# Patient Record
Sex: Female | Born: 1960 | Race: White | Hispanic: No | Marital: Single | Smoking: Never smoker
Health system: Southern US, Community
[De-identification: ages and names within clinical notes are randomized; demographics above are authoritative.]

## PROBLEM LIST (undated history)

## (undated) DIAGNOSIS — M810 Age-related osteoporosis without current pathological fracture: Secondary | ICD-10-CM

## (undated) DIAGNOSIS — E079 Disorder of thyroid, unspecified: Secondary | ICD-10-CM

## (undated) HISTORY — DX: Disorder of thyroid, unspecified: E07.9

## (undated) HISTORY — DX: Age-related osteoporosis without current pathological fracture: M81.0

---

## 1993-04-03 HISTORY — PX: CHOLECYSTECTOMY: SHX55

## 2001-08-30 ENCOUNTER — Ambulatory Visit (HOSPITAL_COMMUNITY): Admission: RE | Admit: 2001-08-30 | Discharge: 2001-08-30 | Payer: Self-pay | Admitting: Obstetrics & Gynecology

## 2002-10-30 ENCOUNTER — Ambulatory Visit (HOSPITAL_COMMUNITY): Admission: RE | Admit: 2002-10-30 | Discharge: 2002-10-30 | Payer: Self-pay | Admitting: Obstetrics & Gynecology

## 2002-10-30 ENCOUNTER — Encounter: Payer: Self-pay | Admitting: Obstetrics & Gynecology

## 2002-11-11 ENCOUNTER — Ambulatory Visit (HOSPITAL_COMMUNITY): Admission: RE | Admit: 2002-11-11 | Discharge: 2002-11-11 | Payer: Self-pay | Admitting: Obstetrics & Gynecology

## 2002-11-11 ENCOUNTER — Encounter: Payer: Self-pay | Admitting: Obstetrics & Gynecology

## 2004-01-21 ENCOUNTER — Ambulatory Visit (HOSPITAL_COMMUNITY): Admission: RE | Admit: 2004-01-21 | Discharge: 2004-01-21 | Payer: Self-pay | Admitting: Obstetrics & Gynecology

## 2004-02-22 ENCOUNTER — Ambulatory Visit (HOSPITAL_COMMUNITY): Admission: RE | Admit: 2004-02-22 | Discharge: 2004-02-22 | Payer: Self-pay | Admitting: Neurology

## 2004-03-29 ENCOUNTER — Emergency Department (HOSPITAL_COMMUNITY): Admission: EM | Admit: 2004-03-29 | Discharge: 2004-03-29 | Payer: Self-pay | Admitting: Emergency Medicine

## 2006-05-09 ENCOUNTER — Ambulatory Visit: Payer: Self-pay | Admitting: Gastroenterology

## 2006-05-16 ENCOUNTER — Ambulatory Visit (HOSPITAL_COMMUNITY): Admission: RE | Admit: 2006-05-16 | Discharge: 2006-05-16 | Payer: Self-pay | Admitting: Gastroenterology

## 2006-05-16 ENCOUNTER — Encounter (INDEPENDENT_AMBULATORY_CARE_PROVIDER_SITE_OTHER): Payer: Self-pay | Admitting: Specialist

## 2006-05-16 ENCOUNTER — Ambulatory Visit: Payer: Self-pay | Admitting: Gastroenterology

## 2006-05-17 ENCOUNTER — Ambulatory Visit (HOSPITAL_COMMUNITY): Admission: RE | Admit: 2006-05-17 | Discharge: 2006-05-17 | Payer: Self-pay | Admitting: Gastroenterology

## 2006-10-09 ENCOUNTER — Ambulatory Visit (HOSPITAL_COMMUNITY): Admission: RE | Admit: 2006-10-09 | Discharge: 2006-10-09 | Payer: Self-pay | Admitting: Obstetrics & Gynecology

## 2006-10-12 ENCOUNTER — Encounter: Admission: RE | Admit: 2006-10-12 | Discharge: 2006-10-12 | Payer: Self-pay | Admitting: Obstetrics & Gynecology

## 2007-03-26 ENCOUNTER — Ambulatory Visit (HOSPITAL_COMMUNITY): Admission: RE | Admit: 2007-03-26 | Discharge: 2007-03-26 | Payer: Self-pay | Admitting: Internal Medicine

## 2007-09-04 ENCOUNTER — Ambulatory Visit (HOSPITAL_COMMUNITY): Admission: RE | Admit: 2007-09-04 | Discharge: 2007-09-04 | Payer: Self-pay | Admitting: Internal Medicine

## 2008-01-16 ENCOUNTER — Encounter: Admission: RE | Admit: 2008-01-16 | Discharge: 2008-01-16 | Payer: Self-pay | Admitting: Obstetrics and Gynecology

## 2009-05-29 IMAGING — CR DG SMALL BOWEL
15 series · 15 of 15 positions shown · non-contrast
Comparison: CT abdomen pelvis of 05/17/2006

CLINICAL DATA: Diarrhea, history of proctitis - - - fluoroscopy
time 1.3 minutes

SMALL BOWEL SERIES
TECHNIQUE: Following ingestion of barium, serial small bowel
images were obtained including spot views of the terminal ileum

[run (1 of 8)]
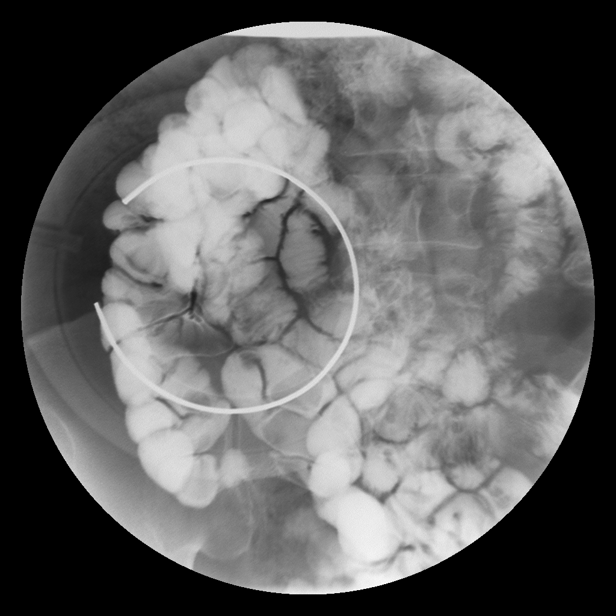

[run (2 of 8)]
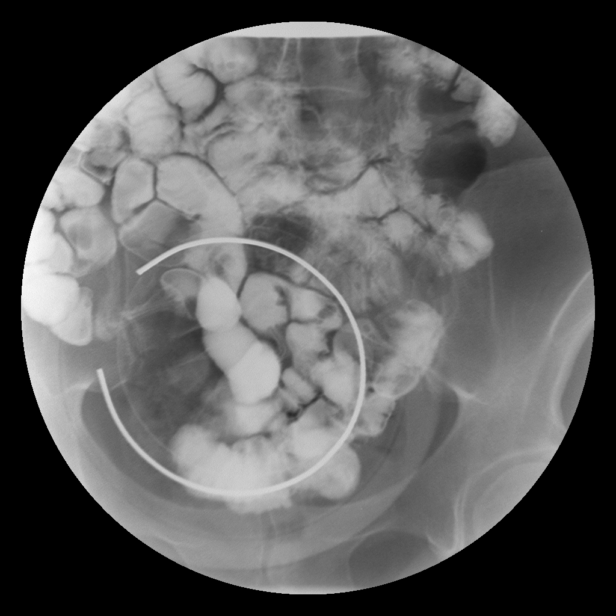

[run (3 of 8)]
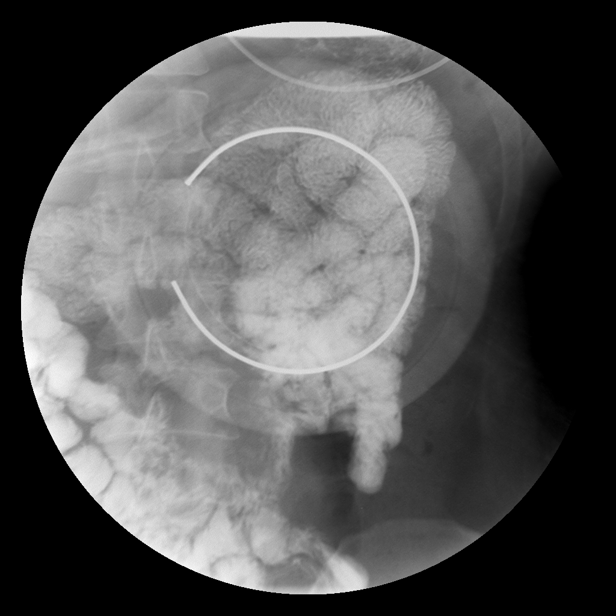

[run (4 of 8)]
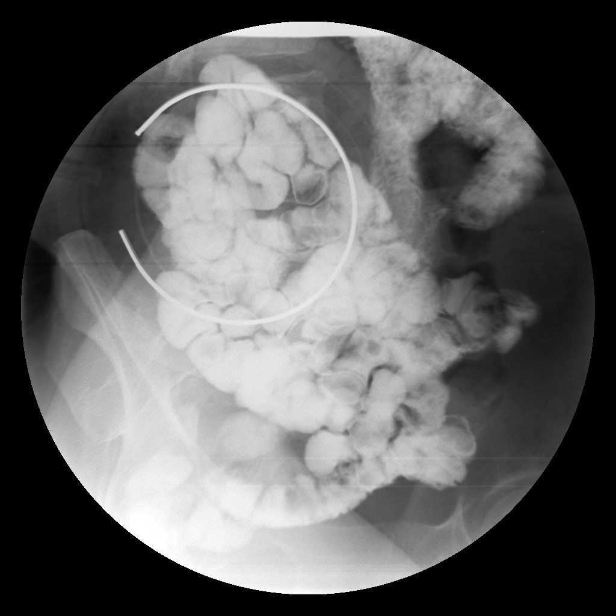

[run (5 of 8)]
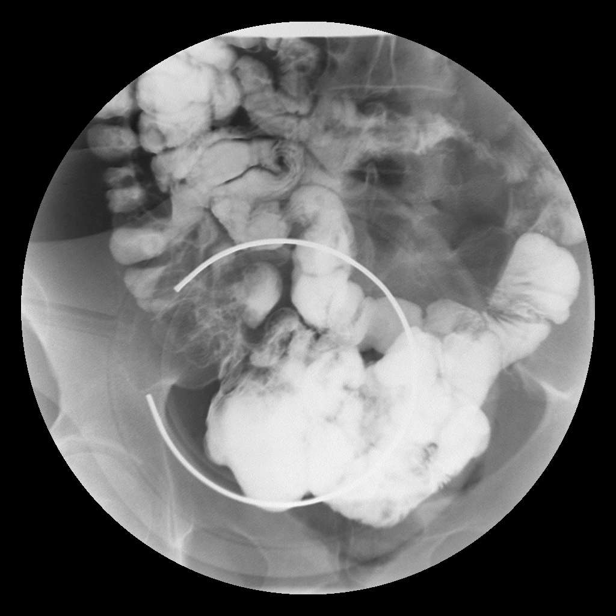

[run (6 of 8)]
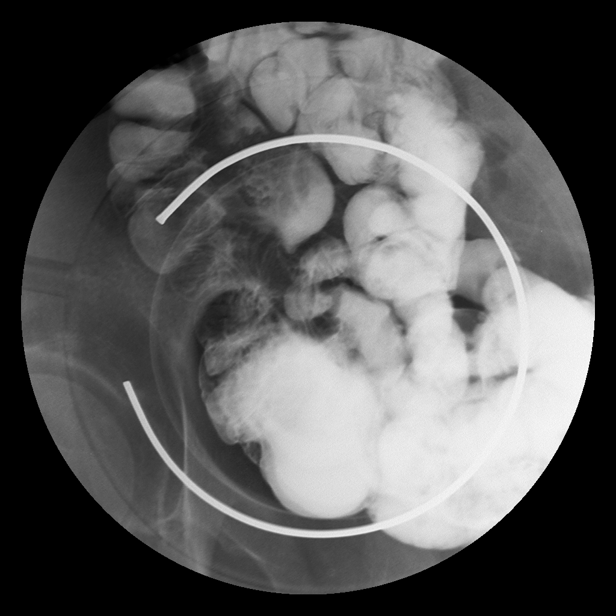

[run (7 of 8)]
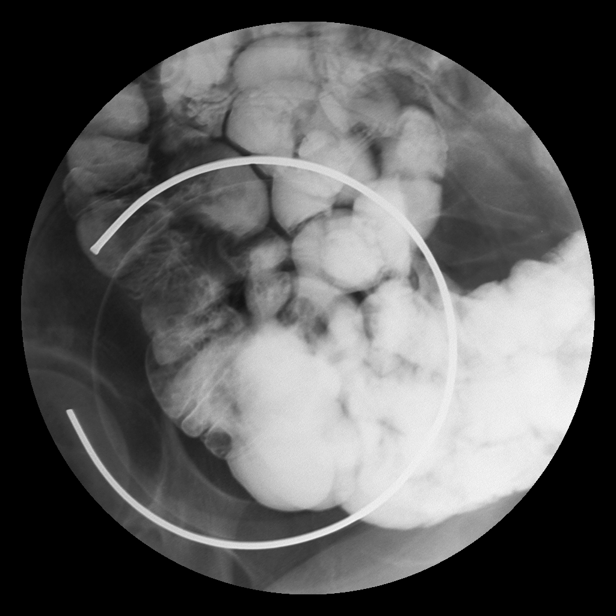

[run (8 of 8)]
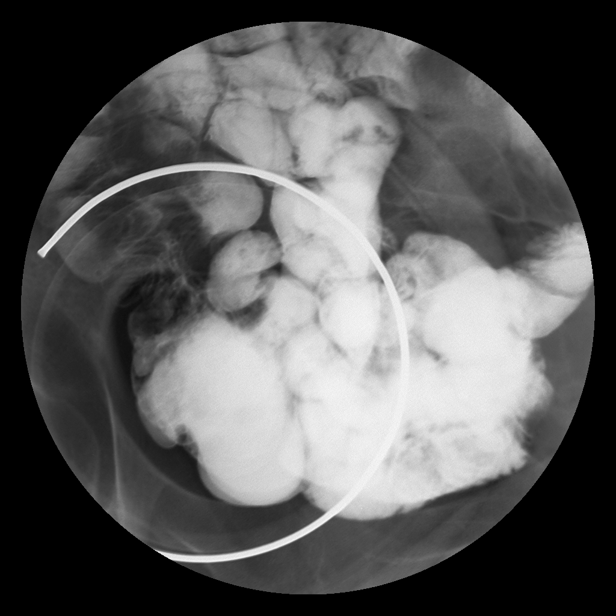

[view not recorded (1 of 7)]
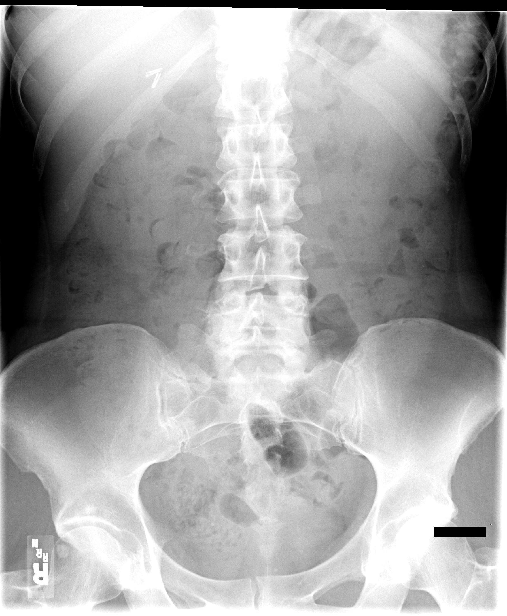

[view not recorded (2 of 7)]
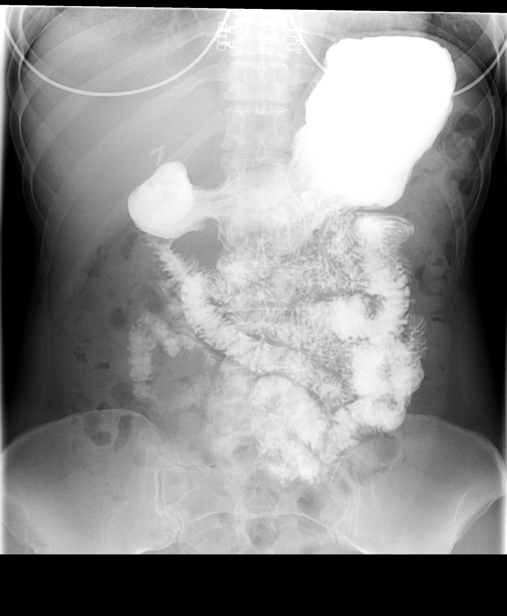

[view not recorded (3 of 7)]
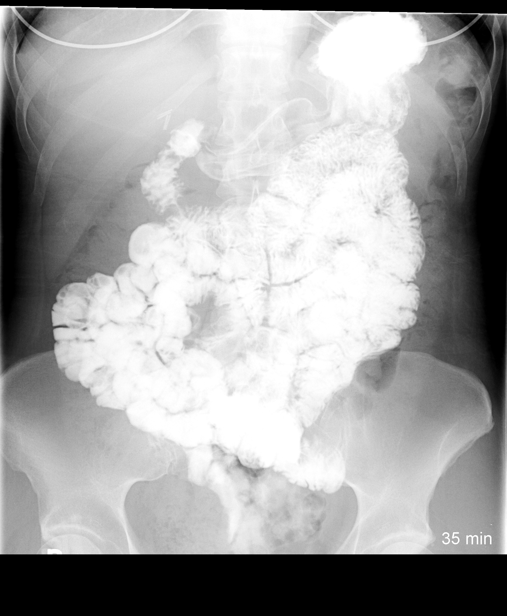

[view not recorded (4 of 7)]
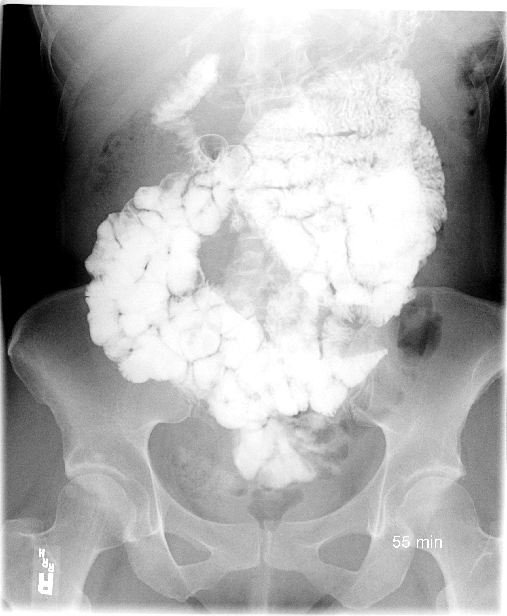

[view not recorded (5 of 7)]
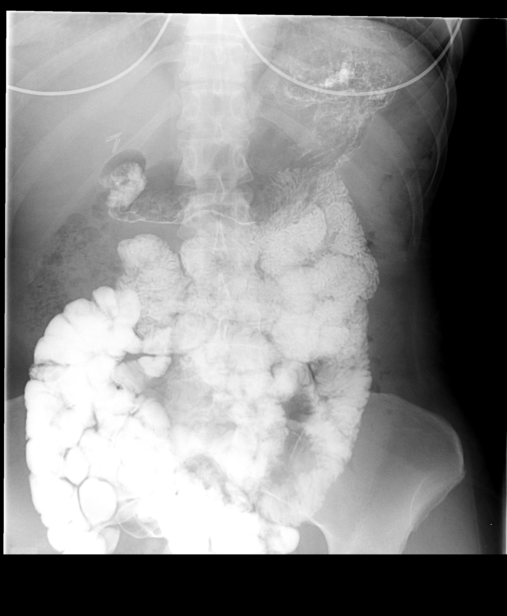

[view not recorded (6 of 7)]
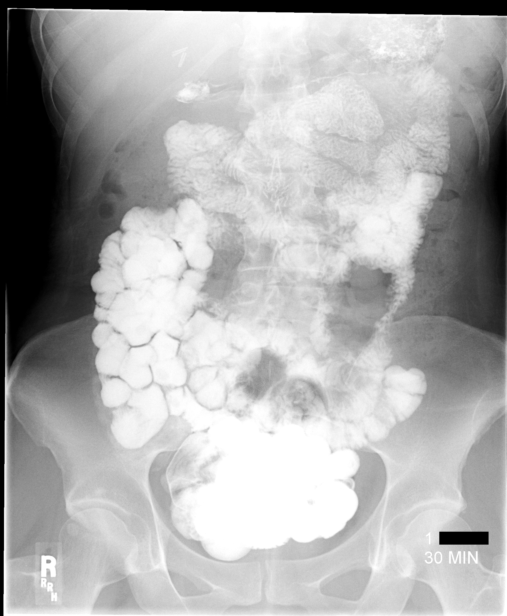

[view not recorded (7 of 7)]
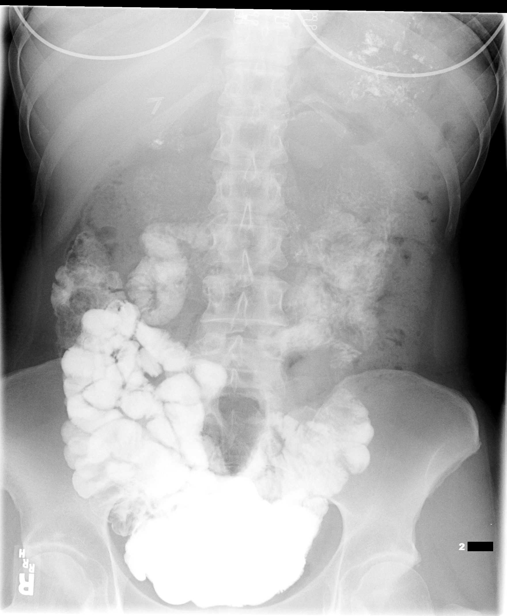

[15 of 15 positions shown; findings below may reference images not displayed]

FINDINGS: A prominent film the abdomen shows feces throughout the
colon.  No bowel obstruction is seen.  Surgical clips are present
from prior cholecystectomy.

The patient was given barium orally and images of the small bowel
were obtained.  The small bowel mucosal pattern appears normal with
no evidence of edema, mass, or dilatation.  The terminal ileum is
overlain within the pelvis, but on oblique spot films appears
grossly normal.
IMPRESSION: Negative small-bowel follow-through.  Terminal ileum not well seen
but appears grossly normal on spot films.

## 2009-09-02 ENCOUNTER — Encounter: Admission: RE | Admit: 2009-09-02 | Discharge: 2009-09-02 | Payer: Self-pay | Admitting: Obstetrics & Gynecology

## 2009-10-21 ENCOUNTER — Other Ambulatory Visit: Admission: RE | Admit: 2009-10-21 | Discharge: 2009-10-21 | Payer: Self-pay | Admitting: Obstetrics and Gynecology

## 2010-04-23 ENCOUNTER — Encounter: Payer: Self-pay | Admitting: Obstetrics & Gynecology

## 2010-04-24 ENCOUNTER — Encounter: Payer: Self-pay | Admitting: Obstetrics & Gynecology

## 2010-08-16 NOTE — Op Note (Signed)
[name], Julia Martin           ACCOUNT NO.:  [id number]   MEDICAL RECORD NO.:  [id number]          PATIENT TYPE:  AMB   LOCATION:  DAY                           FACILITY:  [facility]   PHYSICIAN:  Lionel December, M.D.    DATE OF BIRTH:  03-19-1961   DATE OF PROCEDURE:  03/26/2007  DATE OF DISCHARGE:                               OPERATIVE REPORT   PROCEDURE:  Flexible sigmoidoscopy.   INDICATION:  [name] is a 50 year old, Caucasian female, who has had a  few episodes of hematochezia.  She also has had some discomfort relating  to her hemorrhoids.  She is undergoing sigmoidoscopy to make sure she  not have proctitis or other lesions occult for hematochezia.  Procedure  is reviewed with the patient.  Informed consent was obtained.   MEDICATIONS FOR CONSCIOUS SEDATION:  1. Demerol 50 mg IV.  2. Versed 3 mg IV.   FINDINGS:  Procedure performed in endoscopy suite.  The patient's vital  signs and O2 saturations were monitored during the procedure and  remained stable.  The patient was placed in left lateral position and  rectal examination performed.  No abnormality noted on external or  digital exam.  Pentax video scope was placed in the rectum and advanced  under vision into sigmoid colon.  The scope was passed in descending  colon just below the splenic flexure.  Preparation was satisfactory.  As  the scope was withdrawn, colonic mucosa was carefully examined and was  normal.  Rectal mucosa similarly was normal.  Scope was retroflexed to  examine anorectal junction.  Small hemorrhoids noted below the dentate  line with some thickening through the mucosa of the anal canal.  No  other lesions were noted.  The scope was straightened and withdrawn.  The patient tolerated the procedure well.   FINAL DIAGNOSIS:  Small external hemorrhoids, otherwise normal flexible  sigmoidoscopy.   RECOMMENDATIONS:  1. She will resume her usual diet and medications.  2. She may use Anusol HC cream on  p.r.n. basis for discomfort.  3. She may consider screening for colorectal cancer in 5 years when      she turns 51.      Lionel December, M.D.  Electronically Signed     NR/MEDQ  D:  03/26/2007  T:  03/26/2007  Job:  [id number]   cc:   Kingsley Callander. Ouida Sills, MD  Fax: [phone]

## 2010-08-19 NOTE — Op Note (Signed)
Julia Martin, Julia Martin           ACCOUNT NO.:  [id number]   MEDICAL RECORD NO.:  [id number]          PATIENT TYPE:  AMB   LOCATION:  DAY                           FACILITY:  [facility]   PHYSICIAN:  Kassie Mends, M.D.      DATE OF BIRTH:  January 03, 1961   DATE OF PROCEDURE:  05/16/2006  DATE OF DISCHARGE:                                PROCEDURE NOTE   REFERRING PHYSICIAN:  Kingsley Callander. Ouida Sills, MD   PROCEDURE:  Esophagogastroduodenoscopy with cold forceps biopsies.   ENDOSCOPIST:  Kassie Mends, M.D.   INDICATION FOR EXAM:  Julia Martin is a 50 year old female who  complains of persistent abdominal pain and dyspepsia.  Her symptoms  began in [place].  She had a positive H. pylori which was  treated with Prevpac, but her symptoms are still unresolved.  She  recently went for a jog and had significant abdominal cramping  afterwards.  Her abdominal cramping was in the epigastrium and left  upper quadrant.  She did use anti-inflammatory drugs, but has  discontinued the use.  She also no longer drinks red wine.   FINDINGS:  1. Diffuse erythema of the antrum without erosion or ulceration.      Biopsies obtained to evaluate for H. pylori infection or      eosinophilic infiltration.  2. Otherwise normal esophagus without evidence of Barrett's, normal      duodenum.   RECOMMENDATIONS:  1. Resume previous diet and avoid gastric irritants.  She is given a      handout on gastric irritants.  2  Avoid aspirin and anti-inflammatory drugs for 30 days.  1. No anticoagulation for 7 days.  2. Return patient visit in 1 month.  She should also have a CT scan of      the abdomen with and without IV contrast.  3. I will call her with results of her biopsy report.   MEDICATIONS:  1. Demerol 75 mg IV.  2. Versed 4 mg IV.   PROCEDURE TECHNIQUE:  Physical exam was performed and informed consent  was obtained from the patient after explaining the benefits, risks and  alternatives to the procedure.  The  patient was connected to the monitor  and placed in the left lateral position.  Continuous oxygen was provided  by nasal cannula and IV medicine administered through an indwelling  cannula.  After administration of sedation, the patient's esophagus was  intubated and the  scope was advanced under direct visualization to the second portion of  the duodenum.  The scope was subsequent removed slowly by carefully  examining the color, texture, anatomy and integrity of the mucosa on the  way out.  The patient was recovered in the endoscopy suite and  discharged home in satisfactory condition.      Kassie Mends, M.D.  Electronically Signed     SM/MEDQ  D:  05/16/2006  T:  05/16/2006  Job:  [id number]   cc:   Kingsley Callander. Ouida Sills, MD  Fax: [phone]

## 2010-08-19 NOTE — Op Note (Signed)
[facility]  Patient:    Julia Martin, Julia Martin Visit Number: [id number] MRN: [id number]          Service Type: DSU Location: DAY Attending Physician:  Lazaro Arms Dictated by:   Duane Lope, M.D. Proc. Date: 08/30/01 Admit Date:  08/30/2001                             Operative Report  PREOPERATIVE DIAGNOSES:  1. Hypermenorrhea.  2. Dysmenorrhea.  POSTOPERATIVE DIAGNOSES:  1. Hypermenorrhea.  2. Dysmenorrhea.  PROCEDURE:  Hysteroscopy D&C with a thermochoice endometrial oblation.  SURGEON:  Duane Lope, M.D.  ANESTHESIA:  General with laryngeal mask airway.  FINDINGS:  The patient had a normal endometrial cavity, no polyps, no fibroids, no other abnormalities.  DESCRIPTION OF PROCEDURE:  The patient was taken to the operating room, placed in the supine position and underwent laryngeal mask airway general anesthesia. She was then placed in the dorsal lithotomy position, the vagina was prepped in the usual sterile fashion and she was draped and her bladder was drained. A speculum was placed, cervix was grasped with a single tooth tenaculum and 10 cc of 0.5% Marcaine with 1:200,000 epinephrine was placed as a paracervical block 10 cc on either side. The cervix was then dilated serially to allow passage of a 5 mm hysteroscope. Normal saline was used for a distending media. The endometrial cavity was found to be normal with no polyps, no submucosal abnormalities and no other abnormalities. A vigorous sharp curettage was then performed and tissue sent to pathology for evaluation. The endometrial oblation was then performed. A total of 14 cc of fluid was placed within the balloon and was maintained a pressure of above 180 during the entire procedure. In fact, as the fluid heated up because she has a relatively small uterus that sounded to 7, the pressure went up to 210. We then removed 1 cc of fluid and had a pressure of 180 and heated up again  to 87 degrees Celsius for a total therapy time of 9 minutes and 21 seconds. The patient tolerated the procedure well. The balloon was removed, it was intact and the sterile speculum was removed. There was no bleeding. The patient tolerated the procedure well and she was taken to the recovery room in good stable condition. All counts were correct x3. She had minimal bleeding. Dictated by:   Duane Lope, M.D. Attending Physician:  Lazaro Arms DD:  08/30/01 TD:  08/31/01 Job: [id number]

## 2010-08-19 NOTE — Group Therapy Note (Signed)
NAMEREENE, HARLACHER NO.:  [id number]   MEDICAL RECORD NO.:  [id number]          PATIENT TYPE:  EMS   LOCATION:  ED                            FACILITY:  [facility]   PHYSICIAN:  Kingsley Callander. Ouida Sills, MD       DATE OF BIRTH:  04/07/1960   DATE OF PROCEDURE:  DATE OF DISCHARGE:                                   PROGRESS NOTE   HISTORY:  This patient is a 50 year old white female who presented with a  one day history of nausea and vomiting and diarrhea.  This afternoon she  developed epigastric and chest discomfort.  She was initially seen in my  office and was sent to the emergency room for additional evaluation and  treatment.  She had vomited several times last night.  She had multiple  episodes of diarrhea.  She denied hematemesis, rectal bleeding, or melena.  She has previously had a cholecystectomy.  She takes Levoxyl and Darvocet.  She does not abuse NSAIDS or alcohol.   PHYSICAL EXAMINATION:  VITAL SIGNS:  She was afebrile at 97.1.  Her BP was  152/62 and her pulse was 56.  GENERAL:  She was quite anxious and tachypneic initially.  She had been  quite anxious and uncomfortable at my office prior to that.  HEENT:  No scleral icterus.  Oropharynx appears normal.  NECK:  No JVD or thyromegaly.  LUNGS:  Clear.  HEART:  Regular with no murmurs.  ABDOMEN:  Soft, nondistended.  No palpable organomegaly.  She has mild  epigastric tenderness.  EXTREMITIES:  No edema.  NEUROLOGIC:  Intact.   LABORATORY DATA:  EKG revealed a normal sinus rhythm with no ischemic  changes.  An acute abdominal series is unremarkable.  She has some  nonspecific bowel gas pattern.  White count is 10.1, hemoglobin 13.9,  platelets 287.  Amylase 64, SGOT 46, SGPT 33.  BUN 12, creatinine 0.8,  sodium 134, potassium 3.5.  Troponin-I less than 0.05.  Oxygen saturation  100% on room air.   IMPRESSION:  Gastroenteritis.  She felt as though she would vomit again in  my office but has not vomited since  last night now.  She has been treated  with a gastrointestinal cocktail and with Phenergan and feels much better.  She has been treated with intravenous fluids.  She is now comfortable.  She  will be discharged home and treated further Phenergan if needed.  Her  gastrointestinal cocktail was repeated.  She has been reassured.  She will  stick with clear liquids and then advance her diet to Saltines or plain  toast when she is able to tolerate it.  She was reassured there was no sign  of a cardiac etiology to her epigastric and chest symptoms.     Channing Mutters   ROF/MEDQ  D:  03/29/2004  T:  03/29/2004  Job:  [id number]

## 2010-08-19 NOTE — Consult Note (Signed)
Julia Martin, Julia Martin           ACCOUNT NO.:  [id number]   MEDICAL RECORD NO.:  [id number]         PATIENT TYPE:  AMB   LOCATION:                                FACILITY:  [facility]   PHYSICIAN:  Kassie Mends, M.D.      DATE OF BIRTH:  January 11, 1961   DATE OF CONSULTATION:  05/09/2006  DATE OF DISCHARGE:                                 CONSULTATION   REFERRING PHYSICIAN:  Carylon Perches.   REASON FOR CONSULTATION:  Abdominal pain.   HPI:  Julia Martin is a 50 year old female who has been bothered by her  stomach since Christmas.  She started taking Prilosec, and her symptoms  did not improve so she saw Dr. Ouida Sills.  He checked a H. pylori serology  which was 1.  He treated her with Prevpac, and her symptoms were a  little bit better but not improved.  She continues to take Prilosec 1 in  the morning and 1 at night.  She has a feeling of having a big burp and  she can't get it out.  This sensation is behind her sternum.  She feels  like she has a bad feeling of hunger in her upper abdomen, and eating  makes it better.  She has bad gas pains on her side.  She feels like she  did when she got pumped up for her cholecystectomy.  It is painful.  She  denies any nausea or vomiting, weight loss or constipation.  She has at  least 3 bowel movements a day and is mostly formed, sometimes soft.  She  denies any diarrhea or difficulty swallowing.  Her symptoms come in  waves, and she just wants to sit down.  They occur randomly and can last  all day.  She denies any jaundice.  When she had her gallbladder  removed, her symptoms were similar.  When she had her gallbladder  removed approximately 13 years ago, a HIDA scan revealed gallstones and  that is why her gallbladder was taken out.  She reports she did not have  large stones; they were grainy stones.  She has adjusted her diet.  She  only eats when she has to, and it is not what she wants to eat.  She  prefers broccoli, spinach, steak and chicken,  but now she eats cereal,  rice and toast.  She used to use Motrin 3 to 4 times a week, but since  Christmas, she stopped using all anti-inflammatory drugs.  Now, she uses  Benadryl to sleep.  She used to use Advil P.M.  She also occasionally  drinks red wine, but now she has had no red wine since Christmas.   PAST MEDICAL HISTORY:  1. Hypothyroidism.  2. Back problems.   She denies any history of pancreatitis.   ALLERGIES:  CODEINE.   MEDICATIONS:  1. Levoxyl.  2. Prilosec.   FAMILY HISTORY:  She denies any family history of colon cancer, colon  polyps, breast, uterine or ovarian cancer.   SOCIAL HISTORY:  She is married and has one child.  She works at the  hospital for [employer].  She denies any tobacco use.   REVIEW OF SYSTEMS:  She denies any blood in her stool or black tarry  stools.  Otherwise, her review of systems is per the HPI; otherwise, all  systems are negative.   PHYSICAL EXAMINATION:  Weight 146 pounds, height 5 feet 5 inches, BMI  24.3 (healthy), temperature 93, blood pressure 120/88, pulse 72.  GENERAL:  She is in no apparent distress, alert and orient x4.  HEENT  EXAM:  Is atraumatic, normocephalic.  Pupils equal and reactive to  light.  Mouth no oral lesions.  Posterior pharynx without erythema or  exudate.  LUNGS:  Clear to auscultation bilaterally.  CARDIOVASCULAR  EXAM:  A regular rhythm, no murmur, S1 and S2.  ABDOMEN:  Bowel sounds  present, soft, mild tenderness to palpation in the left upper quadrant,  no rebound or guarding.  No hepatosplenomegaly, no abdominal bruits, no  pulsatile masses.  EXTREMITIES:  Without cyanosis, clubbing or edema.  NEUROLOGICAL:  She has no focal neurologic deficits.   LABORATORY DATA:  BUN 10, creatinine 0.8, AST 24, ALT 24, albumin 3.8,  white count 7, hemoglobin 13.2.   ASSESSMENT:  Julia Martin is a 50 year old female who has a significant  history of NSAID use.  Her symptoms are most likely secondary  to peptic  ulcer disease.  The differential diagnosis includes eosinophilic  gastritis, H. pylori resistant gastritis, non-ulcer dyspepsia, and a low  likelihood of bacterial overgrowth or lactose intolerance. Thank you for  allowing me to see Julia Martin in consultation.  My recommendations  follow.   RECOMMENDATIONS:  1. Julia Martin should continue to use the Prilosec.  2. She should continue to avoid alcohol and anti-inflammatory drugs.  3. I will scheduled for an EGD next week.  I will biopsy her gastric      mucosa.  4. I will obtain a CT scan of the abdomen with and without IV and p.o.      contrast to look for a pancreatic process as an etiology for her      symptoms and also to evaluate for common bile duct stones.  I would      also consider an endoscopic ultrasound to look for microlithiasis      and consider distal common bile duct stones in the differential      diagnosis.      Kassie Mends, M.D.  Electronically Signed     SM/MEDQ  D:  05/09/2006  T:  05/10/2006  Job:  [id number]   cc:   Kingsley Callander. Ouida Sills, MD  Fax: [phone]

## 2012-03-04 ENCOUNTER — Encounter (INDEPENDENT_AMBULATORY_CARE_PROVIDER_SITE_OTHER): Payer: Self-pay | Admitting: *Deleted

## 2012-09-12 ENCOUNTER — Ambulatory Visit (HOSPITAL_COMMUNITY)
Admission: RE | Admit: 2012-09-12 | Discharge: 2012-09-12 | Disposition: A | Discharge status: Home / Self Care | Payer: 59 | Source: Ambulatory Visit | Attending: Internal Medicine | Admitting: Internal Medicine

## 2012-09-12 ENCOUNTER — Other Ambulatory Visit (HOSPITAL_COMMUNITY): Payer: Self-pay | Admitting: Internal Medicine

## 2012-09-12 DIAGNOSIS — R079 Chest pain, unspecified: Secondary | ICD-10-CM | POA: Insufficient documentation

## 2014-06-07 IMAGING — CR DG CHEST 2V
2 series · 2 of 2 positions shown · non-contrast
Comparison: None.

CLINICAL DATA: Mid chest pain

CHEST - 2 VIEW

[view not recorded (1 of 2)]
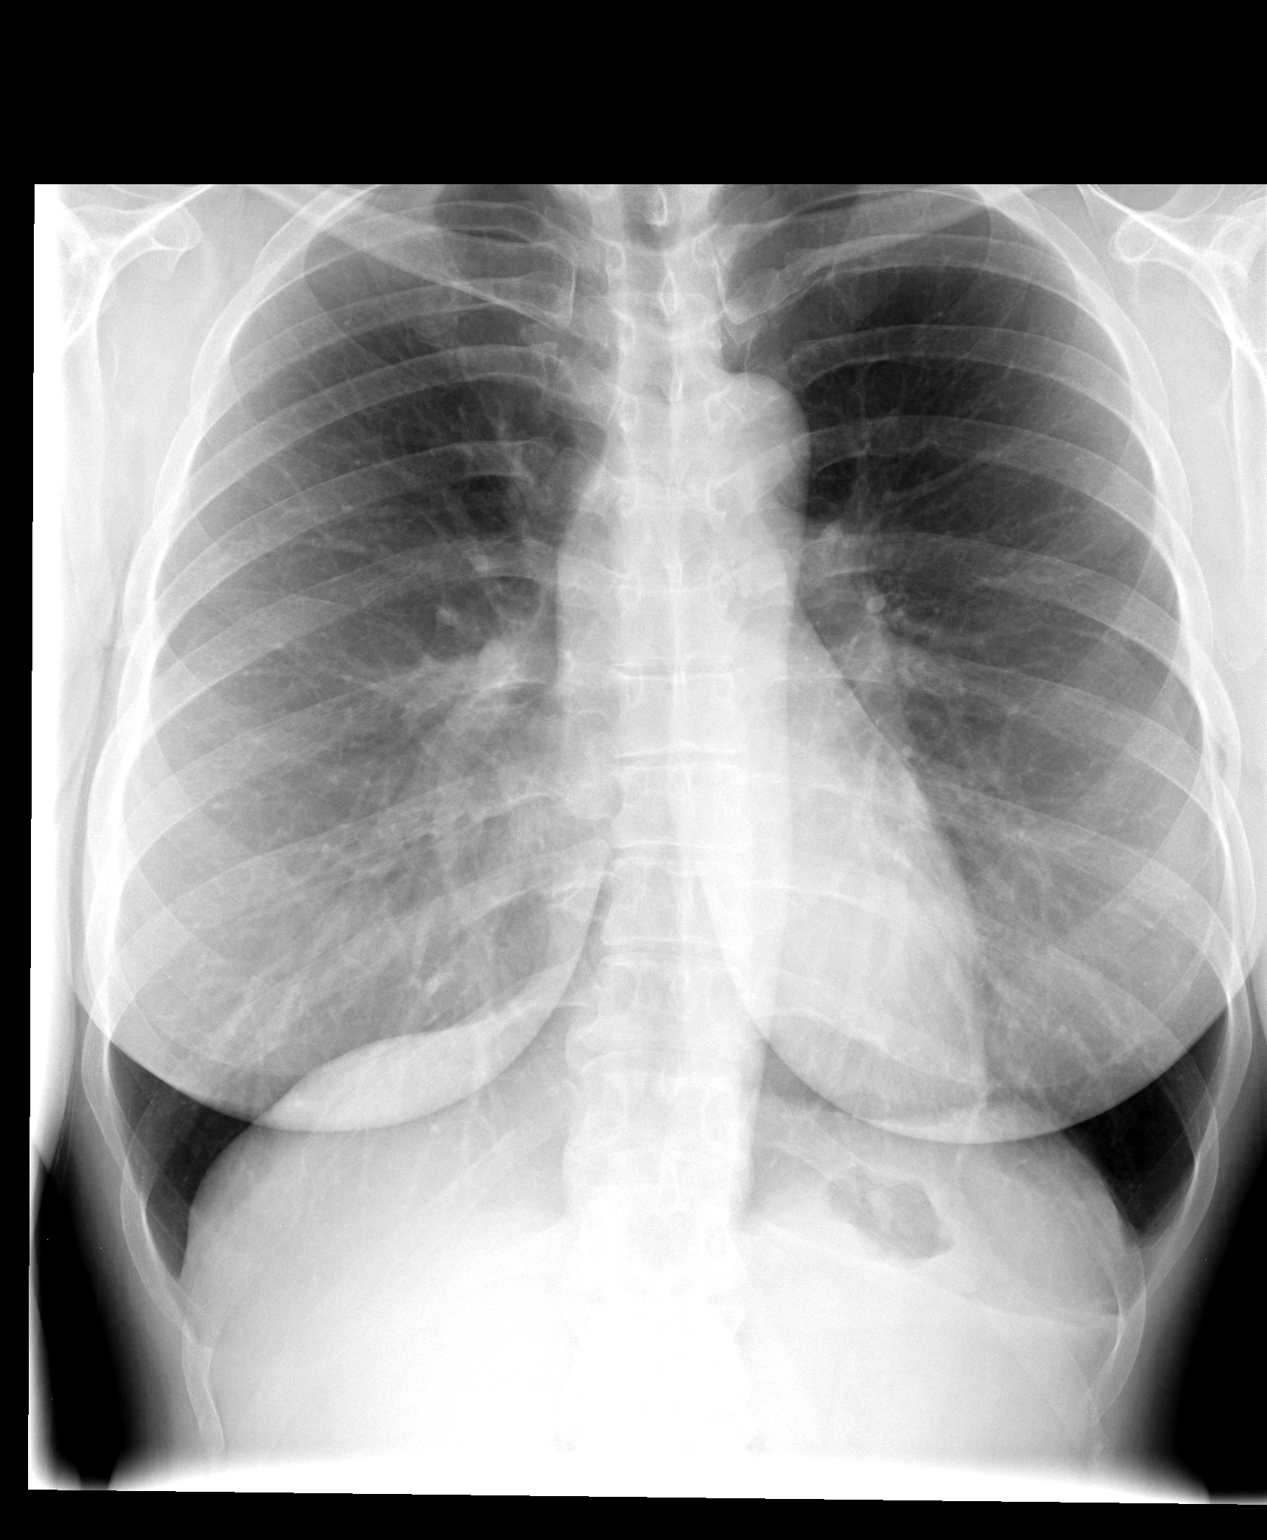

[view not recorded (2 of 2)]
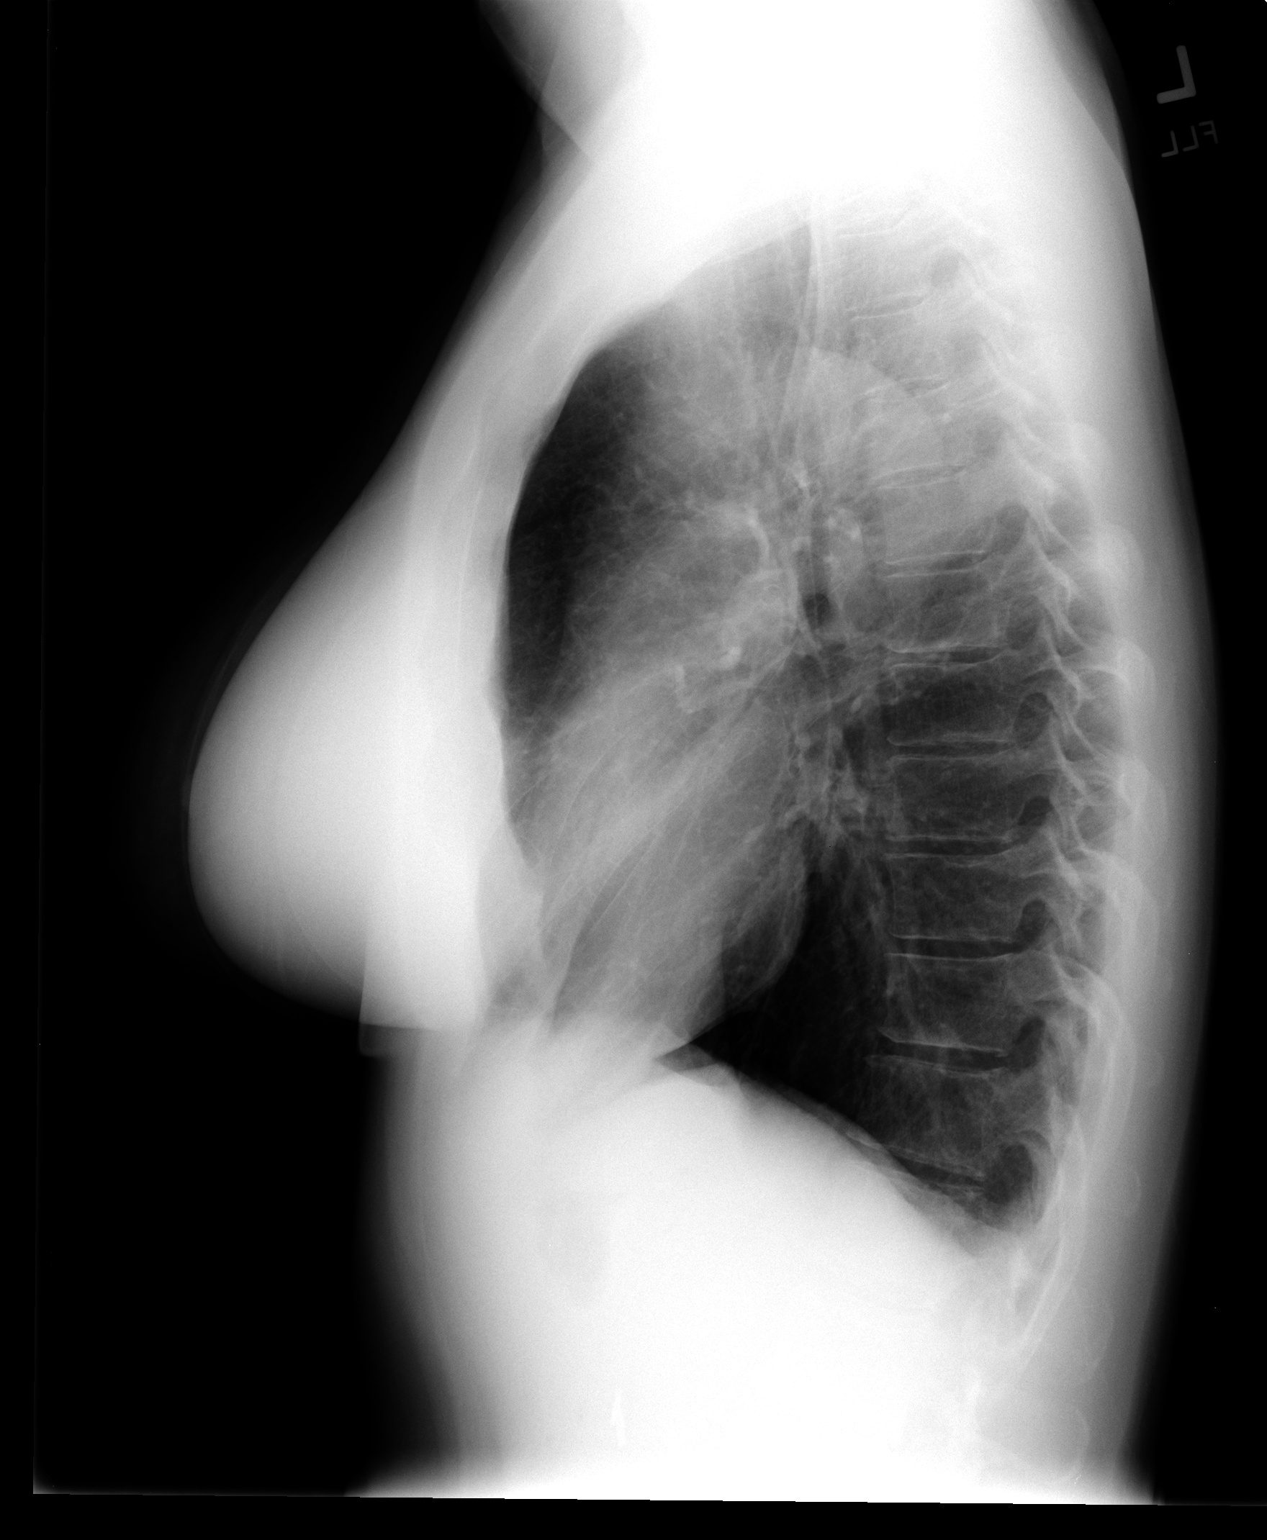

[2 of 2 positions shown; findings below may reference images not displayed]

FINDINGS: Lungs are clear. No pleural effusion or pneumothorax.

Cardiomediastinal silhouette is within normal limits.

Visualized osseous structures are within normal limits.
IMPRESSION: Normal chest radiographs.

## 2015-10-12 DIAGNOSIS — E0501 Thyrotoxicosis with diffuse goiter with thyrotoxic crisis or storm: Secondary | ICD-10-CM | POA: Diagnosis not present

## 2015-10-12 DIAGNOSIS — E890 Postprocedural hypothyroidism: Secondary | ICD-10-CM | POA: Diagnosis not present

## 2015-10-20 DIAGNOSIS — E0501 Thyrotoxicosis with diffuse goiter with thyrotoxic crisis or storm: Secondary | ICD-10-CM | POA: Diagnosis not present

## 2015-10-20 DIAGNOSIS — E890 Postprocedural hypothyroidism: Secondary | ICD-10-CM | POA: Diagnosis not present

## 2016-10-12 DIAGNOSIS — E0501 Thyrotoxicosis with diffuse goiter with thyrotoxic crisis or storm: Secondary | ICD-10-CM | POA: Diagnosis not present

## 2016-10-12 DIAGNOSIS — E890 Postprocedural hypothyroidism: Secondary | ICD-10-CM | POA: Diagnosis not present

## 2016-10-19 DIAGNOSIS — E890 Postprocedural hypothyroidism: Secondary | ICD-10-CM | POA: Diagnosis not present

## 2016-10-19 DIAGNOSIS — E0501 Thyrotoxicosis with diffuse goiter with thyrotoxic crisis or storm: Secondary | ICD-10-CM | POA: Diagnosis not present

## 2016-10-19 DIAGNOSIS — E041 Nontoxic single thyroid nodule: Secondary | ICD-10-CM | POA: Diagnosis not present

## 2017-10-26 ENCOUNTER — Other Ambulatory Visit (HOSPITAL_COMMUNITY)
Admission: RE | Admit: 2017-10-26 | Discharge: 2017-10-26 | Disposition: A | Discharge status: Home / Self Care | Payer: 59 | Source: Other Acute Inpatient Hospital | Attending: Endocrinology | Admitting: Endocrinology

## 2017-10-26 DIAGNOSIS — E041 Nontoxic single thyroid nodule: Secondary | ICD-10-CM | POA: Diagnosis not present

## 2017-10-26 DIAGNOSIS — E890 Postprocedural hypothyroidism: Secondary | ICD-10-CM | POA: Diagnosis not present

## 2017-10-26 DIAGNOSIS — E0501 Thyrotoxicosis with diffuse goiter with thyrotoxic crisis or storm: Secondary | ICD-10-CM | POA: Insufficient documentation

## 2017-10-26 LAB — TSH: TSH: 0.036 u[IU]/mL — ABNORMAL LOW (ref 0.350–4.500)

## 2017-10-26 LAB — T4, FREE: Free T4: 1.56 ng/dL (ref 0.82–1.77)

## 2017-10-27 LAB — MISC LABCORP TEST (SEND OUT): Labcorp test code: 42045

## 2017-10-27 LAB — T3, FREE: T3, Free: 3.3 pg/mL (ref 2.0–4.4)

## 2017-10-27 LAB — T3: T3, Total: 94 ng/dL (ref 71–180)

## 2017-10-27 LAB — THYROID ANTIBODIES
Thyroglobulin Antibody: <1 IU/mL (ref 0.0–0.9)
Thyroperoxidase Ab SerPl-aCnc: 10 IU/mL (ref 0–34)

## 2017-10-27 LAB — T4: T4, Total: 9.4 ug/dL (ref 4.5–12.0)

## 2017-11-01 DIAGNOSIS — E0501 Thyrotoxicosis with diffuse goiter with thyrotoxic crisis or storm: Secondary | ICD-10-CM | POA: Diagnosis not present

## 2017-11-01 DIAGNOSIS — E041 Nontoxic single thyroid nodule: Secondary | ICD-10-CM | POA: Diagnosis not present

## 2017-11-01 DIAGNOSIS — E890 Postprocedural hypothyroidism: Secondary | ICD-10-CM | POA: Diagnosis not present

## 2018-04-08 DIAGNOSIS — L209 Atopic dermatitis, unspecified: Secondary | ICD-10-CM | POA: Diagnosis not present

## 2018-05-05 ENCOUNTER — Emergency Department (HOSPITAL_COMMUNITY): Admission: EM | Admit: 2018-05-05 | Discharge: 2018-05-05 | Discharge status: Against Medical Advice | Payer: 59

## 2018-05-05 NOTE — ED Triage Notes (Signed)
Registration notified RN that pt left prior to being seen in triage.

## 2018-10-24 DIAGNOSIS — E041 Nontoxic single thyroid nodule: Secondary | ICD-10-CM | POA: Diagnosis not present

## 2018-10-24 DIAGNOSIS — E890 Postprocedural hypothyroidism: Secondary | ICD-10-CM | POA: Diagnosis not present

## 2018-10-24 DIAGNOSIS — E0501 Thyrotoxicosis with diffuse goiter with thyrotoxic crisis or storm: Secondary | ICD-10-CM | POA: Diagnosis not present

## 2018-11-01 DIAGNOSIS — E890 Postprocedural hypothyroidism: Secondary | ICD-10-CM | POA: Diagnosis not present

## 2018-11-01 DIAGNOSIS — E041 Nontoxic single thyroid nodule: Secondary | ICD-10-CM | POA: Diagnosis not present

## 2018-11-01 DIAGNOSIS — E0501 Thyrotoxicosis with diffuse goiter with thyrotoxic crisis or storm: Secondary | ICD-10-CM | POA: Diagnosis not present

## 2019-10-31 DIAGNOSIS — E0501 Thyrotoxicosis with diffuse goiter with thyrotoxic crisis or storm: Secondary | ICD-10-CM | POA: Diagnosis not present

## 2019-10-31 DIAGNOSIS — E041 Nontoxic single thyroid nodule: Secondary | ICD-10-CM | POA: Diagnosis not present

## 2019-10-31 DIAGNOSIS — E890 Postprocedural hypothyroidism: Secondary | ICD-10-CM | POA: Diagnosis not present

## 2019-11-07 DIAGNOSIS — E7800 Pure hypercholesterolemia, unspecified: Secondary | ICD-10-CM | POA: Diagnosis not present

## 2019-11-07 DIAGNOSIS — E040 Nontoxic diffuse goiter: Secondary | ICD-10-CM | POA: Diagnosis not present

## 2019-11-07 DIAGNOSIS — E890 Postprocedural hypothyroidism: Secondary | ICD-10-CM | POA: Diagnosis not present

## 2019-11-07 DIAGNOSIS — E041 Nontoxic single thyroid nodule: Secondary | ICD-10-CM | POA: Diagnosis not present

## 2019-11-07 DIAGNOSIS — E0501 Thyrotoxicosis with diffuse goiter with thyrotoxic crisis or storm: Secondary | ICD-10-CM | POA: Diagnosis not present

## 2019-11-07 DIAGNOSIS — M8580 Other specified disorders of bone density and structure, unspecified site: Secondary | ICD-10-CM | POA: Diagnosis not present

## 2019-11-11 DIAGNOSIS — E0501 Thyrotoxicosis with diffuse goiter with thyrotoxic crisis or storm: Secondary | ICD-10-CM | POA: Diagnosis not present

## 2019-11-11 DIAGNOSIS — M8580 Other specified disorders of bone density and structure, unspecified site: Secondary | ICD-10-CM | POA: Diagnosis not present

## 2019-11-11 DIAGNOSIS — E041 Nontoxic single thyroid nodule: Secondary | ICD-10-CM | POA: Diagnosis not present

## 2019-11-11 DIAGNOSIS — E890 Postprocedural hypothyroidism: Secondary | ICD-10-CM | POA: Diagnosis not present

## 2019-11-11 DIAGNOSIS — E7800 Pure hypercholesterolemia, unspecified: Secondary | ICD-10-CM | POA: Diagnosis not present

## 2019-11-11 DIAGNOSIS — Z13820 Encounter for screening for osteoporosis: Secondary | ICD-10-CM | POA: Diagnosis not present

## 2019-11-26 DIAGNOSIS — M8580 Other specified disorders of bone density and structure, unspecified site: Secondary | ICD-10-CM | POA: Diagnosis not present

## 2019-11-26 DIAGNOSIS — E7800 Pure hypercholesterolemia, unspecified: Secondary | ICD-10-CM | POA: Diagnosis not present

## 2019-11-26 DIAGNOSIS — E041 Nontoxic single thyroid nodule: Secondary | ICD-10-CM | POA: Diagnosis not present

## 2019-11-26 DIAGNOSIS — E0501 Thyrotoxicosis with diffuse goiter with thyrotoxic crisis or storm: Secondary | ICD-10-CM | POA: Diagnosis not present

## 2019-11-26 DIAGNOSIS — E890 Postprocedural hypothyroidism: Secondary | ICD-10-CM | POA: Diagnosis not present

## 2020-05-03 DIAGNOSIS — E041 Nontoxic single thyroid nodule: Secondary | ICD-10-CM | POA: Diagnosis not present

## 2020-05-03 DIAGNOSIS — E0501 Thyrotoxicosis with diffuse goiter with thyrotoxic crisis or storm: Secondary | ICD-10-CM | POA: Diagnosis not present

## 2020-05-03 DIAGNOSIS — M8580 Other specified disorders of bone density and structure, unspecified site: Secondary | ICD-10-CM | POA: Diagnosis not present

## 2020-05-03 DIAGNOSIS — E7800 Pure hypercholesterolemia, unspecified: Secondary | ICD-10-CM | POA: Diagnosis not present

## 2020-05-10 DIAGNOSIS — E7800 Pure hypercholesterolemia, unspecified: Secondary | ICD-10-CM | POA: Diagnosis not present

## 2020-05-10 DIAGNOSIS — E890 Postprocedural hypothyroidism: Secondary | ICD-10-CM | POA: Diagnosis not present

## 2020-05-10 DIAGNOSIS — E041 Nontoxic single thyroid nodule: Secondary | ICD-10-CM | POA: Diagnosis not present

## 2020-05-10 DIAGNOSIS — M8580 Other specified disorders of bone density and structure, unspecified site: Secondary | ICD-10-CM | POA: Diagnosis not present

## 2020-05-10 DIAGNOSIS — E0501 Thyrotoxicosis with diffuse goiter with thyrotoxic crisis or storm: Secondary | ICD-10-CM | POA: Diagnosis not present

## 2021-03-10 ENCOUNTER — Ambulatory Visit: Payer: 59

## 2021-03-10 ENCOUNTER — Other Ambulatory Visit: Payer: Self-pay

## 2021-03-10 ENCOUNTER — Ambulatory Visit: Payer: 59 | Admitting: Orthopedic Surgery

## 2021-03-10 ENCOUNTER — Encounter: Payer: Self-pay | Admitting: Orthopedic Surgery

## 2021-03-10 VITALS — BP 157/85 | HR 65 | Ht 64.5 in | Wt 135.0 lb

## 2021-03-10 DIAGNOSIS — S92354A Nondisplaced fracture of fifth metatarsal bone, right foot, initial encounter for closed fracture: Secondary | ICD-10-CM | POA: Diagnosis not present

## 2021-03-10 DIAGNOSIS — S92344A Nondisplaced fracture of fourth metatarsal bone, right foot, initial encounter for closed fracture: Secondary | ICD-10-CM

## 2021-03-10 DIAGNOSIS — M79671 Pain in right foot: Secondary | ICD-10-CM

## 2021-03-10 NOTE — Progress Notes (Signed)
NEW PROBLEM//OFFICE VISIT   Chief Complaint  Patient presents with   Foot Pain    Right foot 02/04/21 injury   60 year old female presents for evaluation of right foot pain  She stepped off a curb twisted her ankle but the heel went down into a hole and there was pressure on the forefoot.  She placed herself in a boot and has worn it since that time during the day and she still working as a Engineer, civil (consulting)  She complains of pain over the fourth and fifth metatarsal of the foot denies any ankle pain    ROS: History of osteoporosis  She denied muscle aches cough chest pain numbness or tingling  BP (!) 157/85   Pulse 65   Ht 5' 4.5" (1.638 m)   Wt 135 lb (61.2 kg)   BMI 22.81 kg/m   Body mass index is 22.81 kg/m.  General appearance: Well-developed well-nourished no gross deformities  Cardiovascular normal pulse and perfusion normal color without edema  Neurologically no sensation loss or deficits or pathologic reflexes  Psychological: Awake alert and oriented x3 mood and affect normal  Skin no lacerations or ulcerations no nodularity no palpable masses, no erythema or nodularity  Musculoskeletal: Right foot alignment is normal ankle exam normal range of motion with no instability negative drawer test  Tenderness is over the fourth and fifth metatarsals there is no malalignment peroneal tendons nontender  History reviewed. No pertinent past medical history.  History reviewed. No pertinent surgical history.  Family History  Problem Relation Age of Onset   Healthy Mother    Healthy Father    Social History   Tobacco Use   Smoking status: Never   Smokeless tobacco: Never    Not on File  Current Meds  Medication Sig   alendronate (FOSAMAX) 70 MG tablet Take 70 mg by mouth once a week.   Calcium Carb-Cholecalciferol 500-10 MG-MCG TABS Take 1 tablet by mouth 2 (two) times daily.   SYNTHROID 125 MCG tablet Take 125 mcg by mouth daily.     MEDICAL DECISION MAKING  A.  No diagnosis found.  B. DATA ANALYSED:   IMAGING: Interpretation of images: I have personally reviewed the images and my interpretation is internal imaging she has a fracture of the fourth and fifth metatarsals they are nondisplaced she has abundant callus over the fourth no callus on the fifth  Orders: No new orders  Outside records reviewed: Not applicable   C. MANAGEMENT   Recommend continue cam walker x-ray in 5 weeks which should be about 8 weeks after injury  No orders of the defined types were placed in this encounter.    Fuller Canada, MD  03/10/2021 8:41 AM

## 2021-03-24 ENCOUNTER — Ambulatory Visit: Payer: 59 | Admitting: Orthopedic Surgery

## 2021-03-24 ENCOUNTER — Encounter: Payer: Self-pay | Admitting: Orthopedic Surgery

## 2021-03-24 ENCOUNTER — Other Ambulatory Visit: Payer: Self-pay

## 2021-03-24 ENCOUNTER — Ambulatory Visit: Payer: 59

## 2021-03-24 DIAGNOSIS — S92354A Nondisplaced fracture of fifth metatarsal bone, right foot, initial encounter for closed fracture: Secondary | ICD-10-CM

## 2021-03-24 DIAGNOSIS — S92344D Nondisplaced fracture of fourth metatarsal bone, right foot, subsequent encounter for fracture with routine healing: Secondary | ICD-10-CM | POA: Diagnosis not present

## 2021-03-24 DIAGNOSIS — S92354D Nondisplaced fracture of fifth metatarsal bone, right foot, subsequent encounter for fracture with routine healing: Secondary | ICD-10-CM

## 2021-03-24 DIAGNOSIS — S92344A Nondisplaced fracture of fourth metatarsal bone, right foot, initial encounter for closed fracture: Secondary | ICD-10-CM

## 2021-03-24 NOTE — Progress Notes (Signed)
Fracture care fourth and fifth metatarsal fractures  Chief Complaint  Patient presents with   Foot Pain    Right foot pain/ improving     Encounter Diagnoses  Name Primary?   Closed nondisplaced fracture of fourth metatarsal bone of right foot, initial encounter Yes   Closed nondisplaced fracture of fifth metatarsal bone of right foot, initial encounter     Crystal says her foot feels fine but her ankle is hurting.  She took the boot off and the ankle pain in the ankle joint seem normal  I went ahead and reexamined the Achilles the peroneal's in the drawer test which were all normal the pain is more along the anterior joint line  It is better now that the boot is off  The x-rays of the foot show fracture healing both fractures  Patient is released

## 2021-05-03 DIAGNOSIS — E0501 Thyrotoxicosis with diffuse goiter with thyrotoxic crisis or storm: Secondary | ICD-10-CM | POA: Diagnosis not present

## 2021-05-03 DIAGNOSIS — E7800 Pure hypercholesterolemia, unspecified: Secondary | ICD-10-CM | POA: Diagnosis not present

## 2021-05-03 DIAGNOSIS — E041 Nontoxic single thyroid nodule: Secondary | ICD-10-CM | POA: Diagnosis not present

## 2021-05-03 DIAGNOSIS — E890 Postprocedural hypothyroidism: Secondary | ICD-10-CM | POA: Diagnosis not present

## 2021-05-03 DIAGNOSIS — M8580 Other specified disorders of bone density and structure, unspecified site: Secondary | ICD-10-CM | POA: Diagnosis not present

## 2021-05-10 DIAGNOSIS — M8580 Other specified disorders of bone density and structure, unspecified site: Secondary | ICD-10-CM | POA: Diagnosis not present

## 2021-05-10 DIAGNOSIS — E890 Postprocedural hypothyroidism: Secondary | ICD-10-CM | POA: Diagnosis not present

## 2021-05-10 DIAGNOSIS — E0501 Thyrotoxicosis with diffuse goiter with thyrotoxic crisis or storm: Secondary | ICD-10-CM | POA: Diagnosis not present

## 2021-05-10 DIAGNOSIS — E041 Nontoxic single thyroid nodule: Secondary | ICD-10-CM | POA: Diagnosis not present

## 2021-05-10 DIAGNOSIS — E7800 Pure hypercholesterolemia, unspecified: Secondary | ICD-10-CM | POA: Diagnosis not present

## 2021-05-18 DIAGNOSIS — E0501 Thyrotoxicosis with diffuse goiter with thyrotoxic crisis or storm: Secondary | ICD-10-CM | POA: Diagnosis not present

## 2021-05-18 DIAGNOSIS — E041 Nontoxic single thyroid nodule: Secondary | ICD-10-CM | POA: Diagnosis not present

## 2021-05-18 DIAGNOSIS — E7800 Pure hypercholesterolemia, unspecified: Secondary | ICD-10-CM | POA: Diagnosis not present

## 2021-05-18 DIAGNOSIS — M810 Age-related osteoporosis without current pathological fracture: Secondary | ICD-10-CM | POA: Diagnosis not present

## 2021-05-18 DIAGNOSIS — E890 Postprocedural hypothyroidism: Secondary | ICD-10-CM | POA: Diagnosis not present

## 2021-06-08 DIAGNOSIS — M810 Age-related osteoporosis without current pathological fracture: Secondary | ICD-10-CM | POA: Diagnosis not present

## 2021-06-08 DIAGNOSIS — E041 Nontoxic single thyroid nodule: Secondary | ICD-10-CM | POA: Diagnosis not present

## 2021-06-08 DIAGNOSIS — E7800 Pure hypercholesterolemia, unspecified: Secondary | ICD-10-CM | POA: Diagnosis not present

## 2021-06-08 DIAGNOSIS — E890 Postprocedural hypothyroidism: Secondary | ICD-10-CM | POA: Diagnosis not present

## 2021-06-08 DIAGNOSIS — E0501 Thyrotoxicosis with diffuse goiter with thyrotoxic crisis or storm: Secondary | ICD-10-CM | POA: Diagnosis not present

## 2022-05-03 DIAGNOSIS — E890 Postprocedural hypothyroidism: Secondary | ICD-10-CM | POA: Diagnosis not present

## 2022-05-03 DIAGNOSIS — E7800 Pure hypercholesterolemia, unspecified: Secondary | ICD-10-CM | POA: Diagnosis not present

## 2022-05-03 DIAGNOSIS — M810 Age-related osteoporosis without current pathological fracture: Secondary | ICD-10-CM | POA: Diagnosis not present

## 2022-05-03 DIAGNOSIS — E0501 Thyrotoxicosis with diffuse goiter with thyrotoxic crisis or storm: Secondary | ICD-10-CM | POA: Diagnosis not present

## 2022-05-11 LAB — TSH: TSH: 0.39 — AB (ref 0.41–5.90)

## 2022-05-12 LAB — LIPID PANEL
Cholesterol: 178 (ref 0–200)
HDL: 94 — AB (ref 35–70)
LDL Cholesterol: 75
Triglycerides: 44 (ref 40–160)

## 2023-04-17 ENCOUNTER — Encounter: Payer: Self-pay | Admitting: "Endocrinology

## 2023-04-17 ENCOUNTER — Ambulatory Visit: Payer: Commercial Managed Care - PPO | Admitting: "Endocrinology

## 2023-04-17 VITALS — BP 148/72 | HR 64 | Ht 63.0 in | Wt 142.0 lb

## 2023-04-17 DIAGNOSIS — M810 Age-related osteoporosis without current pathological fracture: Secondary | ICD-10-CM | POA: Diagnosis not present

## 2023-04-17 DIAGNOSIS — E890 Postprocedural hypothyroidism: Secondary | ICD-10-CM | POA: Diagnosis not present

## 2023-04-17 NOTE — Progress Notes (Signed)
 Endocrinology Consult Note                                            04/17/2023, 4:46 PM   Subjective:    Patient ID: Julia Martin, female    DOB: Apr 09, 1960, PCP Sheryle Carwin, MD   Past Medical History:  Diagnosis Date   Osteoporosis    Thyroid  disease    Past Surgical History:  Procedure Laterality Date   CHOLECYSTECTOMY  1995   Social History   Socioeconomic History   Marital status: Single    Spouse name: Not on file   Number of children: Not on file   Years of education: Not on file   Highest education level: Not on file  Occupational History   Not on file  Tobacco Use   Smoking status: Never   Smokeless tobacco: Never  Vaping Use   Vaping status: Never Used  Substance and Sexual Activity   Alcohol use: Never   Drug use: Never   Sexual activity: Not on file  Other Topics Concern   Not on file  Social History Narrative   Not on file   Social Drivers of Health   Financial Resource Strain: Not on file  Food Insecurity: Not on file  Transportation Needs: Not on file  Physical Activity: Not on file  Stress: Not on file  Social Connections: Not on file   Family History  Problem Relation Age of Onset   Thyroid  disease Mother    Healthy Mother    Thyroid  disease Father    Healthy Father    Outpatient Encounter Medications as of 04/17/2023  Medication Sig   risedronate  (ACTONEL ) 150 MG tablet Take 150 mg by mouth every 30 (thirty) days.   Calcium Carb-Cholecalciferol 500-10 MG-MCG TABS Take 1 tablet by mouth 2 (two) times daily.   SYNTHROID  125 MCG tablet Take 125 mcg by mouth daily.   [DISCONTINUED] alendronate (FOSAMAX) 70 MG tablet Take 70 mg by mouth once a week.   [DISCONTINUED] cholecalciferol (VITAMIN D3) 25 MCG (1000 UNIT) tablet Take 1,000 Units by mouth daily.   No facility-administered encounter medications on file as of 04/17/2023.   ALLERGIES: Allergies  Allergen Reactions   Codeine     VACCINATION STATUS:  There is no  immunization history on file for this patient.  HPI Julia Martin is 63 y.o. female who presents today with a medical history as above. she is being seen in consultation for osteoporosis and RAI induced hypothyroidism requested by Sheryle Carwin, MD.  History is obtained directly from the patient as well as her chart review.  She is transferring her endocrinology care from Dr. Christi. She reports that she was treated with radioactive iodine in 1995 due to Graves' disease which was causing hyperthyroidism.  Over the years she has been on various dose of levothyroxine currently on 125 mcg p.o. daily before breakfast. She does not have recent thyroid  function tests. She was also diagnosed with osteoporosis approximately 3 years ago.  She was treated with bisphosphonates starting with Fosamax, currently on Actonel  150 mg p.o. monthly. Her last bone density was in February 2023 at which time there was some insignificant drop compared to her prior study in bone density. She does not report any fragility fracture. She does not have acute complaints today.  No palpitations, no heat intolerance.  Patient went into menopause in her 54s. She does not report any significant height loss.  Her other medications include calcium/vitamin D  combination.  She was found to have elevated blood pressure today in the clinic at 148/72.  Patient is not on antihypertensive medications.  She does have significant family history of thyroid  dysfunction in her mother, father, as well as siblings.  Review of Systems  Constitutional: +mildly fluctuating body weight,  no fatigue, no subjective hyperthermia, no subjective hypothermia Eyes: no blurry vision, no xerophthalmia ENT: no sore throat, no nodules palpated in throat, no dysphagia/odynophagia, no hoarseness Cardiovascular: no Chest Pain, no Shortness of Breath, no palpitations, no leg swelling Respiratory: no cough, no shortness of breath Gastrointestinal: no  Nausea/Vomiting/Diarhhea Musculoskeletal: no muscle/joint aches Skin: no rashes Neurological: no tremors, no numbness, no tingling, no dizziness Psychiatric: no depression, no anxiety  Objective:       04/17/2023    1:38 PM 03/10/2021    8:25 AM  Vitals with BMI  Height 5' 3 5' 4.5  Weight 142 lbs 135 lbs  BMI 25.16 22.82  Systolic 148 157  Diastolic 72 85  Pulse 64 65    BP (!) 148/72   Pulse 64   Ht 5' 3 (1.6 m)   Wt 142 lb (64.4 kg)   BMI 25.15 kg/m   Wt Readings from Last 3 Encounters:  04/17/23 142 lb (64.4 kg)  03/10/21 135 lb (61.2 kg)    Physical Exam  Constitutional:  Body mass index is 25.15 kg/m.,  not in acute distress, normal state of mind Eyes: PERRLA, EOMI, no exophthalmos ENT: moist mucous membranes, no gross thyromegaly, no gross cervical lymphadenopathy Cardiovascular: normal precordial activity, Regular Rate and Rhythm, no Murmur/Rubs/Gallops Respiratory:  adequate breathing efforts, no gross chest deformity, Clear to auscultation bilaterally Gastrointestinal: abdomen soft, Non -tender, No distension, Bowel Sounds present, no gross organomegaly Musculoskeletal: no gross deformities, strength intact in all four extremities, no peripheral edema Skin: moist, warm, no rashes Neurological: no tremor with outstretched hands, Deep tendon reflexes normal in bilateral lower extremities.      Lab Results  Component Value Date   TSH 0.39 (A) 05/11/2022   TSH 0.036 (L) 10/26/2017   FREET4 1.56 10/26/2017     May 18, 2021 bone density on lumbar spine score was -1.7, wards -2.9 femoral neck -1.4   Assessment & Plan:   1. Hypothyroidism following radioiodine therapy (Primary)  2. Age-related osteoporosis without current pathological fracture   - Julia Martin  is being seen at a kind request of Sheryle Carwin, MD. - I have reviewed her available  records and clinically evaluated the patient. - Based on these reviews, she has osteoporosis  and RAI induced hypothyroidism,  however,  there is not sufficient information to proceed with definitive treatment plan. In the absence of recent thyroid  function tests, she is advised to continue her current dose of levothyroxine 125 mcg p.o. daily before breakfast. She will be considered for repeat thyroid  function test before her next visit in 5 weeks. Her clinical exam is negative for goiter, does not need any thyroid  imaging at this time. Regarding her osteoporosis, tolerating her current medication Boniva 150 mg p.o. monthly.  She is advised to continue.  She will be considered for repeat bone density before her next visit in February 2025.  Her labs will include vitamin D , CMP.    - I did not initiate any new prescriptions today. - she is advised to maintain close follow up  with Sheryle Carwin, MD for primary care needs.   -Thank you for involving me in the care of this pleasant patient.  Time spent with the patient: 46  minutes, of which >50% was spent in  counseling her about her osteoporosis, RAI induced hypothyroidism and the rest in obtaining information about her symptoms, reviewing her previous labs/studies ( including abstractions from other facilities),  evaluations, and treatments,  and developing a plan to confirm diagnosis and long term treatment based on the latest standards of care/guidelines; and documenting her care.  Julia Martin participated in the discussions, expressed understanding, and voiced agreement with the above plans.  All questions were answered to her satisfaction. she is encouraged to contact clinic should she have any questions or concerns prior to her return visit.  Follow up plan: Return in about 5 weeks (around 05/22/2023) for F/U with Pre-visit Labs, DXA Scan B4 NV.   Ranny Earl, MD [facility] [address] [place], [place] [address] Phone: [phone]  Fax: [phone]     04/17/2023,  4:46 PM  This note was partially dictated with voice recognition software. Similar sounding words can be transcribed inadequately or may not  be corrected upon review.

## 2023-05-07 ENCOUNTER — Ambulatory Visit (HOSPITAL_COMMUNITY)
Admission: RE | Admit: 2023-05-07 | Discharge: 2023-05-07 | Disposition: A | Discharge status: Home / Self Care | Payer: Commercial Managed Care - PPO | Source: Ambulatory Visit | Attending: "Endocrinology | Admitting: "Endocrinology

## 2023-05-07 DIAGNOSIS — M810 Age-related osteoporosis without current pathological fracture: Secondary | ICD-10-CM | POA: Insufficient documentation

## 2023-05-07 DIAGNOSIS — Z780 Asymptomatic menopausal state: Secondary | ICD-10-CM | POA: Diagnosis not present

## 2023-05-07 DIAGNOSIS — M8589 Other specified disorders of bone density and structure, multiple sites: Secondary | ICD-10-CM | POA: Diagnosis not present

## 2023-05-18 ENCOUNTER — Other Ambulatory Visit (HOSPITAL_COMMUNITY)
Admission: RE | Admit: 2023-05-18 | Discharge: 2023-05-18 | Disposition: A | Discharge status: Home / Self Care | Payer: Commercial Managed Care - PPO | Source: Ambulatory Visit | Attending: "Endocrinology | Admitting: "Endocrinology

## 2023-05-18 DIAGNOSIS — E890 Postprocedural hypothyroidism: Secondary | ICD-10-CM | POA: Insufficient documentation

## 2023-05-18 DIAGNOSIS — M810 Age-related osteoporosis without current pathological fracture: Secondary | ICD-10-CM | POA: Diagnosis not present

## 2023-05-18 LAB — T4, FREE: Free T4: 0.99 ng/dL (ref 0.61–1.12)

## 2023-05-18 LAB — COMPREHENSIVE METABOLIC PANEL
ALT: 24 U/L (ref 0–44)
AST: 25 U/L (ref 15–41)
Albumin: 4.2 g/dL (ref 3.5–5.0)
Alkaline Phosphatase: 36 U/L — ABNORMAL LOW (ref 38–126)
Anion gap: 10 (ref 5–15)
BUN: 16 mg/dL (ref 8–23)
CO2: 25 mmol/L (ref 22–32)
Calcium: 8.9 mg/dL (ref 8.9–10.3)
Chloride: 100 mmol/L (ref 98–111)
Creatinine, Ser: 0.82 mg/dL (ref 0.44–1.00)
GFR, Estimated: >60 mL/min (ref >60)
Glucose, Bld: 84 mg/dL (ref 70–99)
Potassium: 3.9 mmol/L (ref 3.5–5.1)
Sodium: 135 mmol/L (ref 135–145)
Total Bilirubin: 0.6 mg/dL (ref 0.0–1.2)
Total Protein: 6.8 g/dL (ref 6.5–8.1)

## 2023-05-18 LAB — TSH: TSH: 0.293 u[IU]/mL — ABNORMAL LOW (ref 0.350–4.500)

## 2023-05-18 LAB — VITAMIN D 25 HYDROXY (VIT D DEFICIENCY, FRACTURES): Vit D, 25-Hydroxy: 39.36 ng/mL (ref 30–100)

## 2023-05-21 ENCOUNTER — Other Ambulatory Visit (HOSPITAL_COMMUNITY): Payer: Commercial Managed Care - PPO

## 2023-05-22 ENCOUNTER — Ambulatory Visit: Payer: Commercial Managed Care - PPO | Admitting: "Endocrinology

## 2023-05-22 ENCOUNTER — Telehealth: Payer: Self-pay | Admitting: "Endocrinology

## 2023-05-22 ENCOUNTER — Encounter: Payer: Self-pay | Admitting: "Endocrinology

## 2023-05-22 VITALS — BP 176/78 | HR 68 | Ht 63.0 in | Wt 137.4 lb

## 2023-05-22 DIAGNOSIS — M810 Age-related osteoporosis without current pathological fracture: Secondary | ICD-10-CM | POA: Diagnosis not present

## 2023-05-22 DIAGNOSIS — E890 Postprocedural hypothyroidism: Secondary | ICD-10-CM

## 2023-05-22 DIAGNOSIS — R030 Elevated blood-pressure reading, without diagnosis of hypertension: Secondary | ICD-10-CM | POA: Diagnosis not present

## 2023-05-22 MED ORDER — RISEDRONATE SODIUM 150 MG PO TABS: 150.0000 mg | ORAL_TABLET | ORAL | 2 refills | Status: AC

## 2023-05-22 MED ORDER — SYNTHROID 112 MCG PO TABS: 112.0000 ug | ORAL_TABLET | Freq: Every day | ORAL | 1 refills | Status: DC

## 2023-05-22 NOTE — Progress Notes (Signed)
05/22/2023, 4:28 PM  Endocrinology follow-up note   Subjective:    Patient ID: Julia Martin, female    DOB: September 05, 1960, PCP Carylon Perches, MD   Past Medical History:  Diagnosis Date   Osteoporosis    Thyroid disease    Past Surgical History:  Procedure Laterality Date   CHOLECYSTECTOMY  1995   Social History   Socioeconomic History   Marital status: Single    Spouse name: Not on file   Number of children: Not on file   Years of education: Not on file   Highest education level: Not on file  Occupational History   Not on file  Tobacco Use   Smoking status: Never   Smokeless tobacco: Never  Vaping Use   Vaping status: Never Used  Substance and Sexual Activity   Alcohol use: Never   Drug use: Never   Sexual activity: Not on file  Other Topics Concern   Not on file  Social History Narrative   Not on file   Social Drivers of Health   Financial Resource Strain: Not on file  Food Insecurity: Not on file  Transportation Needs: Not on file  Physical Activity: Not on file  Stress: Not on file  Social Connections: Not on file   Family History  Problem Relation Age of Onset   Thyroid disease Mother    Healthy Mother    Thyroid disease Father    Healthy Father    Outpatient Encounter Medications as of 05/22/2023  Medication Sig   SYNTHROID 112 MCG tablet Take 1 tablet (112 mcg total) by mouth daily before breakfast.   Calcium Carb-Cholecalciferol 500-10 MG-MCG TABS Take 1 tablet by mouth 2 (two) times daily.   risedronate (ACTONEL) 150 MG tablet Take 1 tablet (150 mg total) by mouth every 30 (thirty) days.   [DISCONTINUED] risedronate (ACTONEL) 150 MG tablet Take 150 mg by mouth every 30 (thirty) days.   [DISCONTINUED] SYNTHROID 125 MCG tablet Take 125 mcg by mouth daily.   No facility-administered encounter medications on file as of 05/22/2023.   ALLERGIES: Allergies  Allergen Reactions   Codeine      VACCINATION STATUS:  There is no immunization history on file for this patient.  HPI Julia Martin is 63 y.o. female who presents today with a medical history as above. she is being seen in follow-up after she was seen in consultation for osteoporosis and RAI induced hypothyroidism requested by Carylon Perches, MD.   She is transferring her endocrinology care from Dr. Patrecia Pace. She reports that she was treated with radioactive iodine in 1995 due to Graves' disease which was causing hyperthyroidism.  Over the years she has been on various dose of levothyroxine currently on 125 mcg p.o. daily before breakfast. -Her previsit thyroid function tests are consistent with slight over replacement.  She has no new complaints today. She was also diagnosed with osteoporosis approximately 3 years ago.  She was treated with bisphosphonates starting with Fosamax, currently on Actonel 150 mg p.o. monthly. Her bone density was in February 2023 at which time there was some insignificant drop compared to her prior study in bone density.  Even though it was not compatible due to her different machine,  her recent bone density showed -1.6 T-score on the spine and -2.3 T-score on hips.  This is either stable or slight improvement generally. She does not report any fragility fracture.  No palpitations, no heat intolerance.  Patient went into menopause in her 31s. She does not report any significant height loss.  Her other medications include calcium/vitamin D combination.  She was found to have elevated blood pressure for the second time.  Patient is not on antihypertensive medications.    She does have significant family history of thyroid dysfunction in her mother, father, as well as siblings.  Review of Systems  Constitutional: +mildly fluctuating body weight,  no fatigue, no subjective hyperthermia, no subjective hypothermia Eyes: no blurry vision, no xerophthalmia   Objective:       05/22/2023     9:43 AM 04/17/2023    1:38 PM 03/10/2021    8:25 AM  Vitals with BMI  Height 5\' 3"  5\' 3"  5' 4.5"  Weight 137 lbs 6 oz 142 lbs 135 lbs  BMI 24.35 25.16 22.82  Systolic 176 148 956  Diastolic 78 72 85  Pulse 68 64 65    BP (!) 176/78   Pulse 68   Ht 5\' 3"  (1.6 m)   Wt 137 lb 6.4 oz (62.3 kg)   BMI 24.34 kg/m   Wt Readings from Last 3 Encounters:  05/22/23 137 lb 6.4 oz (62.3 kg)  04/17/23 142 lb (64.4 kg)  03/10/21 135 lb (61.2 kg)    Physical Exam  Constitutional:  Body mass index is 24.34 kg/m.,  not in acute distress, normal state of mind Eyes: PERRLA, EOMI, no exophthalmos ENT: moist mucous membranes, no gross thyromegaly, no gross cervical lymphadenopathy    Lab Results  Component Value Date   TSH 0.293 (L) 05/18/2023   TSH 0.39 (A) 05/11/2022   TSH 0.036 (L) 10/26/2017   FREET4 0.99 05/18/2023   FREET4 1.56 10/26/2017     May 18, 2021 bone density on lumbar spine score was -1.7, wards -2.9 femoral neck -1.4  Most recent previsit bone density on May 07, 2023 AP Spine L1-L3 (L2) 05/07/2023 61.5 Osteopenia -1.6 0.977 g/cm2 - -   DualFemur Neck Right 05/07/2023 61.5 Osteopenia -2.3 0.721 g/cm2 - -   DualFemur Total Mean 05/07/2023 61.5 Osteopenia -1.7 0.799 g/cm2 - - ASSESSMENT: The BMD measured at Femur Neck Right is 0.721 g/cm2 with a T-score of -2.3. This patient is considered osteopenic according to World Health Organization [facility]) criteria. The scan quality is good. L2 and L4 were excluded due to advanced degenerative changes. Patient is not a candidate for FRAX assessment due to being on actonel. Per official position of the ISCD, it is not possible to quantitatively compare BMD or calculate a LSC between different facilities or devices.  Assessment & Plan:   1. Hypothyroidism following radioiodine therapy (Primary) 2. Age-related osteoporosis without current pathological fracture   - Delsy A Shackett  is being seen at a kind request of  Carylon Perches, MD. - I have reviewed her new and available records and clinically evaluated the patient.    - Based on these reviews, she has osteoporosis and RAI induced hypothyroidism.  Her previsit thyroid function tests are consistent with slight over replacement.  I discussed and lowered her levothyroxine to 112 mcg p.o. daily before breakfast.   - We discussed about the correct intake of her thyroid hormone, on empty stomach at fasting, with water, separated by at least 30 minutes from breakfast and  other medications,  and separated by more than 4 hours from calcium, iron, multivitamins, acid reflux medications (PPIs). -Patient is made aware of the fact that thyroid hormone replacement is needed for life, dose to be adjusted by periodic monitoring of thyroid function tests.  Her clinical exam is negative for goiter, does not need any thyroid imaging at this time. Regarding her osteoporosis, tolerating her current medication Boniva 150 mg p.o. monthly.  She seems to have benefited from this treatment with stabilization or slight improvement in her bone density.   She was found to have elevated blood pressure on 2 separate visits.  Patient states that she does not have blood pressure problems.  I shared with her that there may be a concern of hypertension, however needs to be confirmed on home blood pressure monitoring.  She is encouraged to obtain at blood pressure monitor and measure at least once a day and average her numbers.  She will call clinic in 7 to 10 days since she does not have an upcoming appointment with her PCP.   - she is advised to maintain close follow up with Carylon Perches, MD for primary care needs.   I spent  22  minutes in the care of the patient today including review of labs from Thyroid Function, CMP, and other relevant labs ; imaging/biopsy records (current and previous including abstractions from other facilities); face-to-face time discussing  her lab results and  symptoms, medications doses, her options of short and long term treatment based on the latest standards of care / guidelines;   and documenting the encounter.  Waverly A Graybill  participated in the discussions, expressed understanding, and voiced agreement with the above plans.  All questions were answered to her satisfaction. she is encouraged to contact clinic should she have any questions or concerns prior to her return visit.  Follow up plan: Return in about 6 months (around 11/19/2023) for Fasting Labs  in AM B4 8.   Marquis Lunch, MD [facility] [address] [place], [place] [address] Phone: [phone]  Fax: [phone]     05/22/2023, 4:28 PM  This note was partially dictated with voice recognition software. Similar sounding words can be transcribed inadequately or may not  be corrected upon review.

## 2023-05-22 NOTE — Telephone Encounter (Signed)
Pt states she needs her  risedronate (ACTONEL) 150 MG tablet  sent in to [facility]

## 2023-05-23 DIAGNOSIS — E039 Hypothyroidism, unspecified: Secondary | ICD-10-CM | POA: Diagnosis not present

## 2023-05-23 DIAGNOSIS — R030 Elevated blood-pressure reading, without diagnosis of hypertension: Secondary | ICD-10-CM | POA: Diagnosis not present

## 2023-08-12 ENCOUNTER — Emergency Department (HOSPITAL_COMMUNITY)
Admission: EM | Admit: 2023-08-12 | Discharge: 2023-08-12 | Disposition: A | Discharge status: Home / Self Care | Attending: Emergency Medicine | Admitting: Emergency Medicine

## 2023-08-12 ENCOUNTER — Other Ambulatory Visit: Payer: Self-pay

## 2023-08-12 ENCOUNTER — Emergency Department (HOSPITAL_COMMUNITY)

## 2023-08-12 ENCOUNTER — Encounter (HOSPITAL_COMMUNITY): Payer: Self-pay | Admitting: Emergency Medicine

## 2023-08-12 DIAGNOSIS — Z79890 Hormone replacement therapy (postmenopausal): Secondary | ICD-10-CM | POA: Diagnosis not present

## 2023-08-12 DIAGNOSIS — R030 Elevated blood-pressure reading, without diagnosis of hypertension: Secondary | ICD-10-CM | POA: Diagnosis not present

## 2023-08-12 DIAGNOSIS — I160 Hypertensive urgency: Secondary | ICD-10-CM | POA: Diagnosis not present

## 2023-08-12 DIAGNOSIS — E039 Hypothyroidism, unspecified: Secondary | ICD-10-CM | POA: Diagnosis not present

## 2023-08-12 DIAGNOSIS — R519 Headache, unspecified: Secondary | ICD-10-CM | POA: Diagnosis not present

## 2023-08-12 LAB — CBC WITH DIFFERENTIAL/PLATELET
Abs Immature Granulocytes: 0.01 10*3/uL (ref 0.00–0.07)
Basophils Absolute: 0 10*3/uL (ref 0.0–0.1)
Basophils Relative: 1 %
Eosinophils Absolute: 0.1 10*3/uL (ref 0.0–0.5)
Eosinophils Relative: 2 %
HCT: 40.4 % (ref 36.0–46.0)
Hemoglobin: 13.9 g/dL (ref 12.0–15.0)
Immature Granulocytes: 0 %
Lymphocytes Relative: 33 %
Lymphs Abs: 1.8 10*3/uL (ref 0.7–4.0)
MCH: 31.1 pg (ref 26.0–34.0)
MCHC: 34.4 g/dL (ref 30.0–36.0)
MCV: 90.4 fL (ref 80.0–100.0)
Monocytes Absolute: 0.5 10*3/uL (ref 0.1–1.0)
Monocytes Relative: 10 %
Neutro Abs: 2.9 10*3/uL (ref 1.7–7.7)
Neutrophils Relative %: 54 %
Platelets: 265 10*3/uL (ref 150–400)
RBC: 4.47 MIL/uL (ref 3.87–5.11)
RDW: 12.3 % (ref 11.5–15.5)
WBC: 5.4 10*3/uL (ref 4.0–10.5)
nRBC: 0 % (ref 0.0–0.2)

## 2023-08-12 LAB — BASIC METABOLIC PANEL WITH GFR
Anion gap: 8 (ref 5–15)
BUN: 10 mg/dL (ref 8–23)
CO2: 28 mmol/L (ref 22–32)
Calcium: 9 mg/dL (ref 8.9–10.3)
Chloride: 102 mmol/L (ref 98–111)
Creatinine, Ser: 0.87 mg/dL (ref 0.44–1.00)
GFR, Estimated: >60 mL/min (ref >60)
Glucose, Bld: 102 mg/dL — ABNORMAL HIGH (ref 70–99)
Potassium: 4 mmol/L (ref 3.5–5.1)
Sodium: 138 mmol/L (ref 135–145)

## 2023-08-12 MED ORDER — AMLODIPINE BESYLATE 5 MG PO TABS: 5.0000 mg | ORAL_TABLET | Freq: Every day | ORAL | 0 refills | Status: AC

## 2023-08-12 NOTE — ED Triage Notes (Signed)
 Pt with c/o headache x  weeks and states PCP is wanting her to check her BP's regularly to see if she needs to be placed on meds or if she has "anxiety".

## 2023-08-12 NOTE — Discharge Instructions (Signed)
 Begin taking amlodipine as prescribed.  Keep a record of your blood pressures at home and follow-up with your primary doctor in the next week.

## 2023-08-12 NOTE — ED Notes (Signed)
 Patient transported to CT

## 2023-08-12 NOTE — ED Provider Notes (Signed)
 [facility] Provider Note   CSN: [id number] Arrival date & time: 08/12/23  0253     History  Chief Complaint  Patient presents with   Hypertension    Julia Martin is a 63 y.o. female.  Patient is a 63 year old female with history of hypothyroidism, osteoporosis.  Patient presenting today with complaints of elevated blood pressure and headache.  She has been checking her blood pressures at home for the past several weeks and getting numbers of approximately 160 systolic.  She was seen by her primary doctor who thus far has not decided to place her on antihypertensive medications.  She is now experiencing headache which has been persistent for the past 2 days.  She denies any chest pain.  She is concerned she may have a heart attack or stroke based on the abnormal blood pressure.       Home Medications Prior to Admission medications   Medication Sig Start Date End Date Taking? Authorizing Provider  Calcium Carb-Cholecalciferol 500-10 MG-MCG TABS Take 1 tablet by mouth 2 (two) times daily. 01/03/21   [provider]  risedronate  (ACTONEL ) 150 MG tablet Take 1 tablet (150 mg total) by mouth every 30 (thirty) days. 05/22/23   Nida, Gebreselassie W, MD  SYNTHROID  112 MCG tablet Take 1 tablet (112 mcg total) by mouth daily before breakfast. 05/22/23   Nida, Gebreselassie W, MD      Allergies    Codeine    Review of Systems   Review of Systems  All other systems reviewed and are negative.   Physical Exam Updated Vital Signs BP (!) 164/86 (BP Location: Left Arm)   Pulse 76   Temp 97.6 F (36.4 C) (Oral)   Resp 16   Ht 5\' 3"  (1.6 m)   Wt 58.5 kg   SpO2 100%   BMI 22.85 kg/m  Physical Exam Vitals and nursing note reviewed.  Constitutional:      General: She is not in acute distress.    Appearance: She is well-developed. She is not diaphoretic.  HENT:     Head: Normocephalic and atraumatic.  Eyes:     Extraocular  Movements: Extraocular movements intact.     Pupils: Pupils are equal, round, and reactive to light.  Cardiovascular:     Rate and Rhythm: Normal rate and regular rhythm.     Heart sounds: No murmur heard.    No friction rub. No gallop.  Pulmonary:     Effort: Pulmonary effort is normal. No respiratory distress.     Breath sounds: Normal breath sounds. No wheezing.  Abdominal:     General: Bowel sounds are normal. There is no distension.     Palpations: Abdomen is soft.     Tenderness: There is no abdominal tenderness.  Musculoskeletal:        General: Normal range of motion.     Cervical back: Normal range of motion and neck supple.  Skin:    General: Skin is warm and dry.  Neurological:     General: No focal deficit present.     Mental Status: She is alert and oriented to person, place, and time.     Cranial Nerves: No cranial nerve deficit.     Motor: No weakness.     ED Results / Procedures / Treatments   Labs (all labs ordered are listed, but only abnormal results are displayed) Labs Reviewed - No data to display  EKG EKG Interpretation Date/Time:  Sunday  Aug 12 2023 03:49:16 EDT Ventricular Rate:  54 PR Interval:  153 QRS Duration:  86 QT Interval:  430 QTC Calculation: 408 R Axis:   61  Text Interpretation: Sinus bradycardia Otherwise normal ECG Confirmed by Orvilla Blander ([id number]) on 08/12/2023 3:51:49 AM  Radiology No results found.  Procedures Procedures    Medications Ordered in ED Medications - No data to display  ED Course/ Medical Decision Making/ A&P  Patient is a 63 year old female presenting with elevated blood pressure as described in the HPI.  Blood pressure in the 150-160 range with vital signs otherwise stable in the ER.  Laboratory studies obtained including CBC and basic metabolic panel, both of which are normal.  CT scan of the head showing no acute process.  Patient seems appropriate for discharge.  Her blood pressures here have been  only modestly elevated.  Patient extremely anxious about her blood pressure and is requesting a medication.  I have agreed to prescribe amlodipine and have her keep a record of her blood pressures.  She is to follow-up with her primary doctor.  Final Clinical Impression(s) / ED Diagnoses Final diagnoses:  None    Rx / DC Orders ED Discharge Orders     None         Orvilla Blander, MD 08/12/23 [phone]

## 2023-08-13 DIAGNOSIS — R030 Elevated blood-pressure reading, without diagnosis of hypertension: Secondary | ICD-10-CM | POA: Diagnosis not present

## 2023-08-29 ENCOUNTER — Other Ambulatory Visit (HOSPITAL_COMMUNITY): Payer: Self-pay | Admitting: Internal Medicine

## 2023-08-29 DIAGNOSIS — I10 Essential (primary) hypertension: Secondary | ICD-10-CM

## 2023-08-29 DIAGNOSIS — F413 Other mixed anxiety disorders: Secondary | ICD-10-CM | POA: Diagnosis not present

## 2023-08-30 ENCOUNTER — Ambulatory Visit (HOSPITAL_COMMUNITY)
Admission: RE | Admit: 2023-08-30 | Discharge: 2023-08-30 | Disposition: A | Discharge status: Home / Self Care | Payer: Self-pay | Source: Ambulatory Visit | Attending: Internal Medicine | Admitting: Internal Medicine

## 2023-08-30 DIAGNOSIS — I10 Essential (primary) hypertension: Secondary | ICD-10-CM | POA: Insufficient documentation

## 2023-08-30 DIAGNOSIS — Z136 Encounter for screening for cardiovascular disorders: Secondary | ICD-10-CM | POA: Diagnosis not present

## 2023-11-08 ENCOUNTER — Other Ambulatory Visit: Payer: Self-pay

## 2023-11-08 ENCOUNTER — Encounter (HOSPITAL_COMMUNITY): Payer: Self-pay

## 2023-11-08 ENCOUNTER — Emergency Department (HOSPITAL_COMMUNITY)
Admission: EM | Admit: 2023-11-08 | Discharge: 2023-11-09 | Disposition: A | Discharge status: Home / Self Care | Attending: Emergency Medicine | Admitting: Emergency Medicine

## 2023-11-08 DIAGNOSIS — R42 Dizziness and giddiness: Secondary | ICD-10-CM | POA: Diagnosis not present

## 2023-11-08 DIAGNOSIS — R202 Paresthesia of skin: Secondary | ICD-10-CM | POA: Diagnosis not present

## 2023-11-08 DIAGNOSIS — I10 Essential (primary) hypertension: Secondary | ICD-10-CM | POA: Insufficient documentation

## 2023-11-08 DIAGNOSIS — E039 Hypothyroidism, unspecified: Secondary | ICD-10-CM | POA: Insufficient documentation

## 2023-11-08 DIAGNOSIS — R200 Anesthesia of skin: Secondary | ICD-10-CM | POA: Insufficient documentation

## 2023-11-08 DIAGNOSIS — Z79899 Other long term (current) drug therapy: Secondary | ICD-10-CM | POA: Insufficient documentation

## 2023-11-08 MED ORDER — MECLIZINE HCL 12.5 MG PO TABS
25.0000 mg | ORAL_TABLET | Freq: Once | ORAL | Status: AC
  Administered 2023-11-08: 25 mg via ORAL
  Filled 2023-11-08: qty 2

## 2023-11-08 NOTE — ED Triage Notes (Signed)
 Pov from home. Cc of numbness to bilateral arms and  but left arm feels worse and her face. Says that she feels sluggish to move. Has brain fog.  Has been having issues with her bp, took it at home and it was 188/88. She took 81mg  aspirin.  She went to bed feeling normal at 9pm. Woke up at 10:30 and noticed she didn't feel right.

## 2023-11-09 LAB — CBC WITH DIFFERENTIAL/PLATELET
Abs Immature Granulocytes: 0.02 K/uL (ref 0.00–0.07)
Basophils Absolute: 0.1 K/uL (ref 0.0–0.1)
Basophils Relative: 1 %
Eosinophils Absolute: 0.2 K/uL (ref 0.0–0.5)
Eosinophils Relative: 2 %
HCT: 38.2 % (ref 36.0–46.0)
Hemoglobin: 13.3 g/dL (ref 12.0–15.0)
Immature Granulocytes: 0 %
Lymphocytes Relative: 49 %
Lymphs Abs: 4 K/uL (ref 0.7–4.0)
MCH: 31.8 pg (ref 26.0–34.0)
MCHC: 34.8 g/dL (ref 30.0–36.0)
MCV: 91.4 fL (ref 80.0–100.0)
Monocytes Absolute: 0.7 K/uL (ref 0.1–1.0)
Monocytes Relative: 9 %
Neutro Abs: 3.2 K/uL (ref 1.7–7.7)
Neutrophils Relative %: 39 %
Platelets: 299 K/uL (ref 150–400)
RBC: 4.18 MIL/uL (ref 3.87–5.11)
RDW: 12.2 % (ref 11.5–15.5)
WBC: 8.3 K/uL (ref 4.0–10.5)
nRBC: 0 % (ref 0.0–0.2)

## 2023-11-09 LAB — COMPREHENSIVE METABOLIC PANEL WITH GFR
ALT: 18 U/L (ref 0–44)
AST: 22 U/L (ref 15–41)
Albumin: 3.9 g/dL (ref 3.5–5.0)
Alkaline Phosphatase: 41 U/L (ref 38–126)
Anion gap: 12 (ref 5–15)
BUN: 12 mg/dL (ref 8–23)
CO2: 26 mmol/L (ref 22–32)
Calcium: 9 mg/dL (ref 8.9–10.3)
Chloride: 102 mmol/L (ref 98–111)
Creatinine, Ser: 0.76 mg/dL (ref 0.44–1.00)
GFR, Estimated: >60 mL/min
Glucose, Bld: 100 mg/dL — ABNORMAL HIGH (ref 70–99)
Potassium: 4.1 mmol/L (ref 3.5–5.1)
Sodium: 140 mmol/L (ref 135–145)
Total Bilirubin: 0.6 mg/dL (ref 0.0–1.2)
Total Protein: 6.6 g/dL (ref 6.5–8.1)

## 2023-11-09 LAB — TROPONIN I (HIGH SENSITIVITY)
Troponin I (High Sensitivity): <2 ng/L (ref <18)
Troponin I (High Sensitivity): <2 ng/L (ref <18)

## 2023-11-09 NOTE — ED Provider Notes (Signed)
 [place] EMERGENCY DEPARTMENT AT [facility] [place] Provider Note   CSN: [id number] Arrival date & time: 11/08/23  2316     Patient presents with: Numbness   Julia Martin is a 63 y.o. female.   The history is provided by the patient.   She has history of hypertension, hypothyroidism and comes in because of generalized numbness and feeling funky.  She states that she was fine when she went to bed at 9 PM, woke up at 1030 and could hear a whooshing sound in both ears.  She then developed numbness which involved both arms, both sides of her face, both legs.  She states that she just did not feel right and felt funky but could not explain it any further.  She does express some slight off-balance feeling if she turns her head quickly but denies vertigo or nausea.  The whooshing in her ears has stopped but she is very anxious because she was recently diagnosed with hypertension.  She has not had any new medications (has been on amlodipine  for 6 months).    Prior to Admission medications   Medication Sig Start Date End Date Taking? Authorizing Provider  amLODipine  (NORVASC ) 5 MG tablet Take 1 tablet (5 mg total) by mouth daily. 08/12/23   Geroldine Berg, MD  Calcium Carb-Cholecalciferol 500-10 MG-MCG TABS Take 1 tablet by mouth 2 (two) times daily. 01/03/21   [provider]  risedronate  (ACTONEL ) 150 MG tablet Take 1 tablet (150 mg total) by mouth every 30 (thirty) days. 05/22/23   Nida, Gebreselassie W, MD  SYNTHROID  112 MCG tablet Take 1 tablet (112 mcg total) by mouth daily before breakfast. 05/22/23   Nida, Gebreselassie W, MD    Allergies: Codeine    Review of Systems  All other systems reviewed and are negative.   Updated Vital Signs BP (!) 167/72   Pulse 63   Resp 16   Ht 5' 3 (1.6 m)   Wt 58.5 kg   SpO2 100%   BMI 22.85 kg/m   Physical Exam Vitals and nursing note reviewed.   63 year old female, resting comfortably and in no acute distress. Vital signs  are significant for elevated blood pressure. Oxygen saturation is 100%, which is normal. Head is normocephalic and atraumatic. PERRLA, EOMI.  Neck is nontender and supple without adenopathy or JVD.  There are no carotid bruits. Lungs are clear without rales, wheezes, or rhonchi. Heart has regular rate and rhythm without murmur. Abdomen is soft, flat, nontender. Extremities have no cyanosis or edema, full range of motion is present. Skin is warm and dry without rash. Neurologic: Awake and alert and oriented, there is no facial asymmetry, sensation is in tact and symmetric in all 3 divisions of the trigeminal nerve, tongue protrudes in the midline.  Strength is 5/5 in all 4 extremities, there is no pronator drift.  Sensation is intact in all 4 extremities and there is no extinction on double simultaneous stimulation.  Symptoms are not reproduced by passive head movement.  (all labs ordered are listed, but only abnormal results are displayed) Labs Reviewed  COMPREHENSIVE METABOLIC PANEL WITH GFR - Abnormal; Notable for the following components:      Result Value   Glucose, Bld 100 (*)    All other components within normal limits  CBC WITH DIFFERENTIAL/PLATELET  TROPONIN I (HIGH SENSITIVITY)  TROPONIN I (HIGH SENSITIVITY)    EKG: EKG Interpretation Date/Time:  Thursday November 08 2023 23:42:16 EDT Ventricular Rate:  59 PR  Interval:  149 QRS Duration:  91 QT Interval:  407 QTC Calculation: 404 R Axis:   83  Text Interpretation: Sinus rhythm Ventricular premature complex Borderline right axis deviation Artifact in lead(s) I II aVR aVL When compared with ECG of 08/12/2023, Premature ventricular complexes are now present Confirmed by Raford Lenis ([id number]) on 11/09/2023 12:05:19 AM    Procedures  Cardiac monitor shows normal sinus rhythm, per my interpretation. Medications Ordered in the ED  meclizine  (ANTIVERT ) tablet 25 mg (25 mg Oral Given 11/08/23 2358)                                     Medical Decision Making Amount and/or Complexity of Data Reviewed Labs: ordered.   Generalized numbness without localizing findings, doubt stroke.  Episode of tinnitus I suspect was where she was hearing her own carotid artery pulsation.  At this point, I suspect her ongoing symptoms are mainly anxiety.  I have ordered screening labs including troponin.  I have reviewed her electrocardiogram and my interpretation is borderline right axis deviation but no ST or T changes, single PVC.  Compared with prior ECG, only changes present of PVC.  I have also ordered a therapeutic trial of meclizine  to see if some of her symptoms might be disequilibrium related to vestibular dysfunction.  I have reviewed her past records, and see no relevant past visits.  I have reviewed her laboratory tests, my interpretation is normal CBC, normal comprehensive metabolic panel, normal troponin x 2.  No evidence of ACS.  She feels somewhat better following meclizine .  I have recommended that she use over-the-counter meclizine  as needed for disequilibrium.  Blood pressure has been labile in the emergency department.  I have recommended that she monitor her blood pressure at home to get a better handle on whether her antihypertensive medication is adequate.  She states she has a follow-up appointment with her primary care provider scheduled in 3 days and she is to keep that appointment.     Final diagnoses:  Numbness  Dysequilibrium  Elevated blood pressure reading with diagnosis of hypertension    ED Discharge Orders     None          Raford Lenis, MD 11/09/23 [phone]

## 2023-11-09 NOTE — Discharge Instructions (Addendum)
 Take meclizine  25 mg 3 times a day as needed for disequilibrium or dizziness.  I recommend that you check your blood pressure once a day and keep a record of it and show that record to your primary care provider every time you are there for a visit.  This will help your primary care provider decide if you are blood pressure medication is working adequately and if you are medication needs to be adjusted.

## 2023-11-09 NOTE — ED Notes (Signed)
 Pt ambulated to the restroom.

## 2023-11-19 ENCOUNTER — Ambulatory Visit: Payer: Commercial Managed Care - PPO | Admitting: "Endocrinology

## 2023-12-19 ENCOUNTER — Other Ambulatory Visit: Payer: Self-pay | Admitting: "Endocrinology

## 2023-12-31 ENCOUNTER — Other Ambulatory Visit (HOSPITAL_COMMUNITY)
Admission: RE | Admit: 2023-12-31 | Discharge: 2023-12-31 | Disposition: A | Discharge status: Home / Self Care | Source: Ambulatory Visit | Attending: Internal Medicine | Admitting: Internal Medicine

## 2023-12-31 DIAGNOSIS — E785 Hyperlipidemia, unspecified: Secondary | ICD-10-CM | POA: Insufficient documentation

## 2023-12-31 LAB — CBC WITH DIFFERENTIAL/PLATELET
Abs Immature Granulocytes: 0.02 K/uL (ref 0.00–0.07)
Basophils Absolute: 0.1 K/uL (ref 0.0–0.1)
Basophils Relative: 1 %
Eosinophils Absolute: 0.1 K/uL (ref 0.0–0.5)
Eosinophils Relative: 2 %
HCT: 40.3 % (ref 36.0–46.0)
Hemoglobin: 13.9 g/dL (ref 12.0–15.0)
Immature Granulocytes: 0 %
Lymphocytes Relative: 41 %
Lymphs Abs: 2.4 K/uL (ref 0.7–4.0)
MCH: 31.1 pg (ref 26.0–34.0)
MCHC: 34.5 g/dL (ref 30.0–36.0)
MCV: 90.2 fL (ref 80.0–100.0)
Monocytes Absolute: 0.5 K/uL (ref 0.1–1.0)
Monocytes Relative: 8 %
Neutro Abs: 2.7 K/uL (ref 1.7–7.7)
Neutrophils Relative %: 48 %
Platelets: 348 K/uL (ref 150–400)
RBC: 4.47 MIL/uL (ref 3.87–5.11)
RDW: 12.3 % (ref 11.5–15.5)
WBC: 5.8 K/uL (ref 4.0–10.5)
nRBC: 0 % (ref 0.0–0.2)

## 2023-12-31 LAB — COMPREHENSIVE METABOLIC PANEL WITH GFR
ALT: 21 U/L (ref 0–44)
AST: 24 U/L (ref 15–41)
Albumin: 3.9 g/dL (ref 3.5–5.0)
Alkaline Phosphatase: 43 U/L (ref 38–126)
Anion gap: 7 (ref 5–15)
BUN: 16 mg/dL (ref 8–23)
CO2: 25 mmol/L (ref 22–32)
Calcium: 9 mg/dL (ref 8.9–10.3)
Chloride: 104 mmol/L (ref 98–111)
Creatinine, Ser: 0.83 mg/dL (ref 0.44–1.00)
GFR, Estimated: >60 mL/min (ref >60)
Glucose, Bld: 90 mg/dL (ref 70–99)
Potassium: 4.1 mmol/L (ref 3.5–5.1)
Sodium: 136 mmol/L (ref 135–145)
Total Bilirubin: 0.7 mg/dL (ref 0.0–1.2)
Total Protein: 6.8 g/dL (ref 6.5–8.1)

## 2023-12-31 LAB — VITAMIN D 25 HYDROXY (VIT D DEFICIENCY, FRACTURES): Vit D, 25-Hydroxy: 41.4 ng/mL (ref 30–100)

## 2023-12-31 LAB — LIPID PANEL
Cholesterol: 226 mg/dL — ABNORMAL HIGH (ref 0–200)
HDL: 94 mg/dL (ref >40)
LDL Cholesterol: 126 mg/dL — ABNORMAL HIGH (ref 0–99)
Total CHOL/HDL Ratio: 2.4 ratio
Triglycerides: 31 mg/dL (ref <150)
VLDL: 6 mg/dL (ref 0–40)

## 2023-12-31 LAB — T4, FREE: Free T4: 1.02 ng/dL (ref 0.61–1.12)

## 2023-12-31 LAB — TSH: TSH: 0.675 u[IU]/mL (ref 0.350–4.500)
